# Patient Record
Sex: Male | Born: 2000 | Race: White | Hispanic: No | Marital: Single | Smoking: Former smoker
Health system: Southern US, Community
[De-identification: ages and names within clinical notes are randomized; demographics above are authoritative.]

## PROBLEM LIST (undated history)

## (undated) ENCOUNTER — Ambulatory Visit (HOSPITAL_COMMUNITY): Payer: Medicaid Other

## (undated) DIAGNOSIS — F902 Attention-deficit hyperactivity disorder, combined type: Principal | ICD-10-CM

## (undated) DIAGNOSIS — F909 Attention-deficit hyperactivity disorder, unspecified type: Secondary | ICD-10-CM

## (undated) DIAGNOSIS — T7840XA Allergy, unspecified, initial encounter: Secondary | ICD-10-CM

## (undated) DIAGNOSIS — F419 Anxiety disorder, unspecified: Secondary | ICD-10-CM

## (undated) DIAGNOSIS — F32A Depression, unspecified: Secondary | ICD-10-CM

## (undated) DIAGNOSIS — R278 Other lack of coordination: Secondary | ICD-10-CM

## (undated) HISTORY — DX: Other lack of coordination: R27.8

## (undated) HISTORY — PX: MYRINGOTOMY WITH TUBE PLACEMENT: SHX5663

## (undated) HISTORY — DX: Allergy, unspecified, initial encounter: T78.40XA

## (undated) HISTORY — DX: Depression, unspecified: F32.A

## (undated) HISTORY — PX: EYE MUSCLE SURGERY: SHX370

## (undated) HISTORY — DX: Attention-deficit hyperactivity disorder, combined type: F90.2

## (undated) HISTORY — DX: Anxiety disorder, unspecified: F41.9

## (undated) HISTORY — PX: ADENOIDECTOMY: SUR15

---
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
## 2022-01-05 ENCOUNTER — Ambulatory Visit (HOSPITAL_COMMUNITY)
Admission: AD | Admit: 2022-01-05 | Discharge: 2022-01-05 | Disposition: A | Discharge status: Home / Self Care | Payer: Medicaid Other | Attending: Psychiatry | Admitting: Psychiatry

## 2022-01-13 ENCOUNTER — Ambulatory Visit (HOSPITAL_COMMUNITY)
Admission: EM | Admit: 2022-01-13 | Discharge: 2022-01-13 | Disposition: A | Discharge status: Other Acute Inpatient Hospital | Payer: Medicaid Other | Attending: Registered Nurse | Admitting: Registered Nurse

## 2022-01-13 ENCOUNTER — Encounter (HOSPITAL_COMMUNITY): Payer: Self-pay | Admitting: Registered Nurse

## 2022-01-13 DIAGNOSIS — F902 Attention-deficit hyperactivity disorder, combined type: Secondary | ICD-10-CM

## 2022-01-13 DIAGNOSIS — Z1152 Encounter for screening for COVID-19: Secondary | ICD-10-CM | POA: Insufficient documentation

## 2022-01-13 DIAGNOSIS — F988 Other specified behavioral and emotional disorders with onset usually occurring in childhood and adolescence: Secondary | ICD-10-CM | POA: Diagnosis present

## 2022-01-13 DIAGNOSIS — F332 Major depressive disorder, recurrent severe without psychotic features: Secondary | ICD-10-CM

## 2022-01-13 DIAGNOSIS — Z818 Family history of other mental and behavioral disorders: Secondary | ICD-10-CM | POA: Insufficient documentation

## 2022-01-13 DIAGNOSIS — F640 Transsexualism: Secondary | ICD-10-CM | POA: Insufficient documentation

## 2022-01-13 DIAGNOSIS — F603 Borderline personality disorder: Secondary | ICD-10-CM

## 2022-01-13 DIAGNOSIS — Z9151 Personal history of suicidal behavior: Secondary | ICD-10-CM | POA: Insufficient documentation

## 2022-01-13 DIAGNOSIS — F411 Generalized anxiety disorder: Secondary | ICD-10-CM

## 2022-01-13 DIAGNOSIS — F649 Gender identity disorder, unspecified: Secondary | ICD-10-CM | POA: Diagnosis present

## 2022-01-13 DIAGNOSIS — R45851 Suicidal ideations: Secondary | ICD-10-CM

## 2022-01-13 LAB — LIPID PANEL
Cholesterol: 202 mg/dL — ABNORMAL HIGH (ref 0–200)
HDL: 30 mg/dL — ABNORMAL LOW (ref >40)
LDL Cholesterol: 143 mg/dL — ABNORMAL HIGH (ref 0–99)
Total CHOL/HDL Ratio: 6.7 RATIO
Triglycerides: 147 mg/dL (ref <150)
VLDL: 29 mg/dL (ref 0–40)

## 2022-01-13 LAB — COMPREHENSIVE METABOLIC PANEL
ALT: 20 U/L (ref 0–44)
AST: 19 U/L (ref 15–41)
Albumin: 4.3 g/dL (ref 3.5–5.0)
Alkaline Phosphatase: 93 U/L (ref 38–126)
Anion gap: 7 (ref 5–15)
BUN: 11 mg/dL (ref 6–20)
CO2: 25 mmol/L (ref 22–32)
Calcium: 9.2 mg/dL (ref 8.9–10.3)
Chloride: 106 mmol/L (ref 98–111)
Creatinine, Ser: 0.57 mg/dL — ABNORMAL LOW (ref 0.61–1.24)
GFR, Estimated: >60 mL/min (ref >60)
Glucose, Bld: 85 mg/dL (ref 70–99)
Potassium: 4.4 mmol/L (ref 3.5–5.1)
Sodium: 138 mmol/L (ref 135–145)
Total Bilirubin: 0.5 mg/dL (ref 0.3–1.2)
Total Protein: 7 g/dL (ref 6.5–8.1)

## 2022-01-13 LAB — CBC WITH DIFFERENTIAL/PLATELET
Abs Immature Granulocytes: 0.02 10*3/uL (ref 0.00–0.07)
Basophils Absolute: 0 10*3/uL (ref 0.0–0.1)
Basophils Relative: 0 %
Eosinophils Absolute: 0.1 10*3/uL (ref 0.0–0.5)
Eosinophils Relative: 1 %
HCT: 40.5 % (ref 39.0–52.0)
Hemoglobin: 14 g/dL (ref 13.0–17.0)
Immature Granulocytes: 0 %
Lymphocytes Relative: 35 %
Lymphs Abs: 3.4 10*3/uL (ref 0.7–4.0)
MCH: 28.9 pg (ref 26.0–34.0)
MCHC: 34.6 g/dL (ref 30.0–36.0)
MCV: 83.7 fL (ref 80.0–100.0)
Monocytes Absolute: 0.6 10*3/uL (ref 0.1–1.0)
Monocytes Relative: 6 %
Neutro Abs: 5.5 10*3/uL (ref 1.7–7.7)
Neutrophils Relative %: 58 %
Platelets: 268 10*3/uL (ref 150–400)
RBC: 4.84 MIL/uL (ref 4.22–5.81)
RDW: 11.9 % (ref 11.5–15.5)
WBC: 9.7 10*3/uL (ref 4.0–10.5)
nRBC: 0 % (ref 0.0–0.2)

## 2022-01-13 LAB — POCT URINE DRUG SCREEN - MANUAL ENTRY (I-SCREEN)
POC Amphetamine UR: NOT DETECTED
POC Buprenorphine (BUP): NOT DETECTED
POC Cocaine UR: NOT DETECTED
POC Marijuana UR: NOT DETECTED
POC Methadone UR: NOT DETECTED
POC Methamphetamine UR: NOT DETECTED
POC Morphine: NOT DETECTED
POC Oxazepam (BZO): NOT DETECTED
POC Oxycodone UR: NOT DETECTED
POC Secobarbital (BAR): NOT DETECTED

## 2022-01-13 LAB — TSH: TSH: 4.557 u[IU]/mL — ABNORMAL HIGH (ref 0.350–4.500)

## 2022-01-13 LAB — RESP PANEL BY RT-PCR (FLU A&B, COVID) ARPGX2
Influenza A by PCR: NEGATIVE
Influenza B by PCR: NEGATIVE
SARS Coronavirus 2 by RT PCR: NEGATIVE

## 2022-01-13 LAB — MAGNESIUM: Magnesium: 2.1 mg/dL (ref 1.7–2.4)

## 2022-01-13 LAB — ETHANOL: Alcohol, Ethyl (B): <10 mg/dL (ref <10)

## 2022-01-13 LAB — POC SARS CORONAVIRUS 2 AG: SARSCOV2ONAVIRUS 2 AG: NEGATIVE

## 2022-01-13 MED ORDER — ESTRADIOL VALERATE 10 MG/ML IM OIL: 0.4000 mg | TOPICAL_OIL | INTRAMUSCULAR | Status: DC

## 2022-01-13 MED ORDER — HYDROXYZINE HCL 25 MG PO TABS: 25.0000 mg | ORAL_TABLET | Freq: Three times a day (TID) | ORAL | Status: DC | PRN

## 2022-01-13 MED ORDER — ACETAMINOPHEN 325 MG PO TABS: 650.0000 mg | ORAL_TABLET | Freq: Four times a day (QID) | ORAL | Status: DC | PRN

## 2022-01-13 MED ORDER — TRAZODONE HCL 100 MG PO TABS
100.0000 mg | ORAL_TABLET | Freq: Every day | ORAL | Status: DC
  Administered 2022-01-13: 100 mg via ORAL
  Filled 2022-01-13: qty 1

## 2022-01-13 MED ORDER — FLUTICASONE PROPIONATE 50 MCG/ACT NA SUSP: 1.0000 | Freq: Every day | NASAL | Status: DC | PRN

## 2022-01-13 MED ORDER — ARIPIPRAZOLE 5 MG PO TABS
5.0000 mg | ORAL_TABLET | Freq: Every day | ORAL | Status: DC
  Administered 2022-01-13: 5 mg via ORAL
  Filled 2022-01-13: qty 1

## 2022-01-13 MED ORDER — HYDROXYZINE HCL 25 MG PO TABS: 25.0000 mg | ORAL_TABLET | Freq: Four times a day (QID) | ORAL | Status: DC | PRN

## 2022-01-13 MED ORDER — MAGNESIUM HYDROXIDE 400 MG/5ML PO SUSP: 30.0000 mL | Freq: Every day | ORAL | Status: DC | PRN

## 2022-01-13 MED ORDER — ALUM & MAG HYDROXIDE-SIMETH 200-200-20 MG/5ML PO SUSP: 30.0000 mL | ORAL | Status: DC | PRN

## 2022-01-13 MED ORDER — VENLAFAXINE HCL ER 150 MG PO CP24: 150.0000 mg | ORAL_CAPSULE | Freq: Every day | ORAL | Status: DC

## 2022-01-13 MED ORDER — PROGESTERONE MICRONIZED 100 MG PO CAPS
100.0000 mg | ORAL_CAPSULE | Freq: Every day | ORAL | Status: DC
  Filled 2022-01-13 (×2): qty 1
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
## 2022-01-13 NOTE — ED Notes (Signed)
Patient 21 y.o., Caucasian adult male to male transgender wants to be called "Scott [name]."  patient presented to [facility] as a walk in via [facility] with complaints of worsening depression, anxiety and suicidal ideation with plan to jump off the bridge.Patient is A&Ox4.  Patient denies HI and AVH. patient continues to have SI but contracts for safety in hospital. Patient denies any physical complaints when asked. No acute distress noted. Support and encouragement provided. Routine safety checks conducted according to facility protocol. Encouraged patient to notify staff if thoughts of harm toward self or others arise. Patient verbalize understanding and agreement. Will continue to monitor for safety.

## 2022-01-13 NOTE — Progress Notes (Signed)
Pt was accepted to [facility] TODAY 01/13/22 PENDING Labs, Negative COVID PCR, signed vol consent faxed to [phone]; Bed Assignment 401-1  Pt meets inpatient criteria per Assunta Found, NP  Attending Physician will be Dr. Sherron Flemings  Report can be called to: -Adult unit: [phone]  Pt can arrive: Night [facility] will assist and coordinate with care team  Care Team notified: Day [facility] Rona Ravens, RN, Night [facility] Gretta Arab, RN, Melynda Ripple, Counselor, and [place], NP  [place], LCSWA 01/13/2022 @ 8:26 PM

## 2022-01-14 ENCOUNTER — Encounter (HOSPITAL_COMMUNITY): Payer: Self-pay

## 2022-01-14 ENCOUNTER — Encounter (HOSPITAL_COMMUNITY): Payer: Self-pay | Admitting: Psychiatry

## 2022-01-14 ENCOUNTER — Other Ambulatory Visit: Payer: Self-pay

## 2022-01-14 ENCOUNTER — Inpatient Hospital Stay (HOSPITAL_COMMUNITY)
Admission: AD | Admit: 2022-01-14 | Discharge: 2022-01-18 | DRG: 885 | Disposition: A | Discharge status: Home / Self Care | Payer: Medicaid Other | Source: Intra-hospital | Attending: Psychiatry | Admitting: Psychiatry

## 2022-01-14 DIAGNOSIS — Z9151 Personal history of suicidal behavior: Secondary | ICD-10-CM

## 2022-01-14 DIAGNOSIS — F603 Borderline personality disorder: Secondary | ICD-10-CM | POA: Diagnosis present

## 2022-01-14 DIAGNOSIS — G4700 Insomnia, unspecified: Secondary | ICD-10-CM | POA: Diagnosis present

## 2022-01-14 DIAGNOSIS — Z79899 Other long term (current) drug therapy: Secondary | ICD-10-CM | POA: Diagnosis not present

## 2022-01-14 DIAGNOSIS — Z79891 Long term (current) use of opiate analgesic: Secondary | ICD-10-CM

## 2022-01-14 DIAGNOSIS — F332 Major depressive disorder, recurrent severe without psychotic features: Secondary | ICD-10-CM | POA: Diagnosis present

## 2022-01-14 DIAGNOSIS — F322 Major depressive disorder, single episode, severe without psychotic features: Secondary | ICD-10-CM | POA: Diagnosis present

## 2022-01-14 DIAGNOSIS — F329 Major depressive disorder, single episode, unspecified: Secondary | ICD-10-CM | POA: Insufficient documentation

## 2022-01-14 DIAGNOSIS — F640 Transsexualism: Secondary | ICD-10-CM | POA: Diagnosis present

## 2022-01-14 DIAGNOSIS — Z818 Family history of other mental and behavioral disorders: Secondary | ICD-10-CM

## 2022-01-14 DIAGNOSIS — F902 Attention-deficit hyperactivity disorder, combined type: Secondary | ICD-10-CM | POA: Diagnosis present

## 2022-01-14 DIAGNOSIS — Z560 Unemployment, unspecified: Secondary | ICD-10-CM

## 2022-01-14 DIAGNOSIS — F4310 Post-traumatic stress disorder, unspecified: Secondary | ICD-10-CM | POA: Diagnosis present

## 2022-01-14 DIAGNOSIS — Z813 Family history of other psychoactive substance abuse and dependence: Secondary | ICD-10-CM | POA: Diagnosis not present

## 2022-01-14 DIAGNOSIS — F411 Generalized anxiety disorder: Secondary | ICD-10-CM | POA: Diagnosis present

## 2022-01-14 DIAGNOSIS — F330 Major depressive disorder, recurrent, mild: Secondary | ICD-10-CM

## 2022-01-14 DIAGNOSIS — K219 Gastro-esophageal reflux disease without esophagitis: Secondary | ICD-10-CM | POA: Diagnosis present

## 2022-01-14 DIAGNOSIS — Z79890 Hormone replacement therapy (postmenopausal): Secondary | ICD-10-CM

## 2022-01-14 DIAGNOSIS — Z23 Encounter for immunization: Secondary | ICD-10-CM

## 2022-01-14 DIAGNOSIS — Z62811 Personal history of psychological abuse in childhood: Secondary | ICD-10-CM | POA: Diagnosis not present

## 2022-01-14 DIAGNOSIS — R45851 Suicidal ideations: Secondary | ICD-10-CM

## 2022-01-14 MED ORDER — MAGNESIUM HYDROXIDE 400 MG/5ML PO SUSP: 30.0000 mL | Freq: Every day | ORAL | Status: DC | PRN

## 2022-01-14 MED ORDER — TRAZODONE HCL 100 MG PO TABS
100.0000 mg | ORAL_TABLET | Freq: Every day | ORAL | Status: DC
  Administered 2022-01-14 – 2022-01-17 (×4): 100 mg via ORAL
  Filled 2022-01-14 (×6): qty 1

## 2022-01-14 MED ORDER — INFLUENZA VAC SPLIT QUAD 0.5 ML IM SUSY
0.5000 mL | PREFILLED_SYRINGE | INTRAMUSCULAR | Status: AC
  Administered 2022-01-15: 0.5 mL via INTRAMUSCULAR
  Filled 2022-01-14: qty 0.5

## 2022-01-14 MED ORDER — ARIPIPRAZOLE 15 MG PO TABS
7.5000 mg | ORAL_TABLET | Freq: Every day | ORAL | Status: DC
  Administered 2022-01-14 – 2022-01-17 (×4): 7.5 mg via ORAL
  Filled 2022-01-14 (×6): qty 1

## 2022-01-14 MED ORDER — ACETAMINOPHEN 325 MG PO TABS: 650.0000 mg | ORAL_TABLET | Freq: Four times a day (QID) | ORAL | Status: DC | PRN

## 2022-01-14 MED ORDER — MELATONIN 3 MG PO TABS
3.0000 mg | ORAL_TABLET | Freq: Every day | ORAL | Status: DC
  Filled 2022-01-14 (×2): qty 1

## 2022-01-14 MED ORDER — PROGESTERONE MICRONIZED 100 MG PO CAPS
100.0000 mg | ORAL_CAPSULE | Freq: Every day | ORAL | Status: DC
  Administered 2022-01-14 – 2022-01-18 (×5): 100 mg via ORAL
  Filled 2022-01-14 (×7): qty 1

## 2022-01-14 MED ORDER — HYDROXYZINE HCL 25 MG PO TABS
25.0000 mg | ORAL_TABLET | Freq: Four times a day (QID) | ORAL | Status: DC | PRN
  Administered 2022-01-14 – 2022-01-18 (×6): 25 mg via ORAL
  Filled 2022-01-14 (×6): qty 1

## 2022-01-14 MED ORDER — ARIPIPRAZOLE 5 MG PO TABS
5.0000 mg | ORAL_TABLET | Freq: Every day | ORAL | Status: DC
  Filled 2022-01-14: qty 1

## 2022-01-14 MED ORDER — VENLAFAXINE HCL ER 150 MG PO CP24
150.0000 mg | ORAL_CAPSULE | Freq: Every day | ORAL | Status: DC
  Administered 2022-01-14 – 2022-01-18 (×5): 150 mg via ORAL
  Filled 2022-01-14 (×8): qty 1

## 2022-01-14 MED ORDER — ESTRADIOL VALERATE 10 MG/ML IM OIL: 0.4000 mg | TOPICAL_OIL | INTRAMUSCULAR | Status: DC

## 2022-01-14 MED ORDER — ALUM & MAG HYDROXIDE-SIMETH 200-200-20 MG/5ML PO SUSP
30.0000 mL | ORAL | Status: DC | PRN
  Administered 2022-01-14 – 2022-01-15 (×2): 30 mL via ORAL
  Filled 2022-01-14 (×2): qty 30

## 2022-01-14 MED ORDER — FLUTICASONE PROPIONATE 50 MCG/ACT NA SUSP: 1.0000 | Freq: Every day | NASAL | Status: DC | PRN
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
## 2022-01-14 NOTE — Progress Notes (Signed)
EKG completed and tracing on front of chart with tracing from [facility].

## 2022-01-14 NOTE — BH IP Treatment Plan (Signed)
Interdisciplinary Treatment and Diagnostic Plan Update  01/14/2022 Time of Session: 10:00am  Scott [name] MRN: [id number]  Principal Diagnosis: MDD (major depressive disorder)  Secondary Diagnoses: Principal Problem:   MDD (major depressive disorder)   Current Medications:  Current Facility-Administered Medications  Medication Dose Route Frequency Provider Last Rate Last Admin   acetaminophen (TYLENOL) tablet 650 mg  650 mg Oral Q6H PRN Evette Georges, NP       alum & mag hydroxide-simeth (MAALOX/MYLANTA) 200-200-20 MG/5ML suspension 30 mL  30 mL Oral Q4H PRN Evette Georges, NP       ARIPiprazole (ABILIFY) tablet 7.5 mg  7.5 mg Oral QHS Nwoko, Agnes I, NP       [START ON 01/18/2022] Estradiol Valerate OIL 0.4 mg  0.4 mg Intramuscular Weekly Rankin, Shuvon B, NP       fluticasone (FLONASE) 50 MCG/ACT nasal spray 1 spray  1 spray Each Nare Daily PRN Rankin, Shuvon B, NP       hydrOXYzine (ATARAX) tablet 25 mg  25 mg Oral Q6H PRN Rankin, Shuvon B, NP   25 mg at 01/14/22 0108   [START ON 01/15/2022] influenza vac split quadrivalent PF (FLUARIX) injection 0.5 mL  0.5 mL Intramuscular Tomorrow-1000 Evette Georges, NP       magnesium hydroxide (MILK OF MAGNESIA) suspension 30 mL  30 mL Oral Daily PRN Evette Georges, NP       progesterone (PROMETRIUM) capsule 100 mg  100 mg Oral Daily Rankin, Shuvon B, NP   100 mg at 01/14/22 0818   traZODone (DESYREL) tablet 100 mg  100 mg Oral QHS Rankin, Shuvon B, NP       venlafaxine XR (EFFEXOR-XR) 24 hr capsule 150 mg  150 mg Oral Q breakfast Rankin, Shuvon B, NP   150 mg at 01/14/22 0818   PTA Medications: Medications Prior to Admission  Medication Sig Dispense Refill Last Dose   ARIPiprazole (ABILIFY) 5 MG tablet Take 1 tablet (5 mg total) by mouth at bedtime. 30 tablet 3    desvenlafaxine (PRISTIQ) 100 MG 24 hr tablet Take 1 tablet (100 mg total) by mouth daily. 30 tablet 3    dexmethylphenidate (FOCALIN XR) 20 MG 24 hr capsule Take 1 capsule (20 mg  total) by mouth daily. 30 capsule 0    Estradiol Valerate 10 MG/ML OIL INJECT 0.3 ML (3 MG) ONCE WEEKLY INTO THE MUSCLE AS DIRECTED 5 mL 1    fluticasone (FLONASE) 50 MCG/ACT nasal spray Place 1 spray into both nostrils daily as needed for allergies.      guanFACINE (INTUNIV) 2 MG TB24 ER tablet Take 1 tablet (2 mg total) by mouth daily. 90 tablet 1    hydrOXYzine (ATARAX) 25 MG tablet Take 1 tablet (25 mg total) by mouth every 6 (six) hours as needed for anxiety. 90 tablet 2    levocetirizine (XYZAL) 5 MG tablet Take 1 tablet (5 mg total) by mouth every evening. 90 tablet 1    montelukast (SINGULAIR) 10 MG tablet Take 10 mg by mouth daily.      NEEDLE, DISP, 18 G 18G X 1" MISC Use 1 needle weekly for IM estrogen administration 20 each 1    progesterone (PROMETRIUM) 100 MG capsule Take 1 capsule (100 mg total) by mouth daily. 30 capsule 3    Syringe, Disposable, 3 ML MISC Use 1 weekly for estradiol injections 25 each 0    traZODone (DESYREL) 100 MG tablet Take 1 tablet (100 mg total) by mouth at bedtime. [place]  tablet 2    triamcinolone ointment (KENALOG) 0.5 % Apply 1 application. topically 2 (two) times daily. 60 g 3     Patient Stressors: Financial difficulties   Marital or family conflict    Patient Strengths: Average or above average intelligence  Motivation for treatment/growth   Treatment Modalities: Medication Management, Group therapy, Case management,  1 to 1 session with clinician, Psychoeducation, Recreational therapy.   Physician Treatment Plan for Primary Diagnosis: MDD (major depressive disorder) Long Term Goal(s): Improvement in symptoms so as ready for discharge   Short Term Goals: Ability to identify and develop effective coping behaviors will improve Compliance with prescribed medications will improve Ability to identify triggers associated with substance abuse/mental health issues will improve Ability to identify changes in lifestyle to reduce recurrence of condition  will improve Ability to verbalize feelings will improve Ability to disclose and discuss suicidal ideas  Medication Management: Evaluate patient's response, side effects, and tolerance of medication regimen.  Therapeutic Interventions: 1 to 1 sessions, Unit Group sessions and Medication administration.  Evaluation of Outcomes: Not Met  Physician Treatment Plan for Secondary Diagnosis: Principal Problem:   MDD (major depressive disorder)  Long Term Goal(s): Improvement in symptoms so as ready for discharge   Short Term Goals: Ability to identify and develop effective coping behaviors will improve Compliance with prescribed medications will improve Ability to identify triggers associated with substance abuse/mental health issues will improve Ability to identify changes in lifestyle to reduce recurrence of condition will improve Ability to verbalize feelings will improve Ability to disclose and discuss suicidal ideas     Medication Management: Evaluate patient's response, side effects, and tolerance of medication regimen.  Therapeutic Interventions: 1 to 1 sessions, Unit Group sessions and Medication administration.  Evaluation of Outcomes: Not Met   RN Treatment Plan for Primary Diagnosis: MDD (major depressive disorder) Long Term Goal(s): Knowledge of disease and therapeutic regimen to maintain health will improve  Short Term Goals: Ability to remain free from injury will improve, Ability to participate in decision making will improve, Ability to verbalize feelings will improve, Ability to disclose and discuss suicidal ideas, and Ability to identify and develop effective coping behaviors will improve  Medication Management: RN will administer medications as ordered by provider, will assess and evaluate patient's response and provide education to patient for prescribed medication. RN will report any adverse and/or side effects to prescribing provider.  Therapeutic Interventions: 1 on  1 counseling sessions, Psychoeducation, Medication administration, Evaluate responses to treatment, Monitor vital signs and CBGs as ordered, Perform/monitor CIWA, COWS, AIMS and Fall Risk screenings as ordered, Perform wound care treatments as ordered.  Evaluation of Outcomes: Not Met   LCSW Treatment Plan for Primary Diagnosis: MDD (major depressive disorder) Long Term Goal(s): Safe transition to appropriate next level of care at discharge, Engage patient in therapeutic group addressing interpersonal concerns.  Short Term Goals: Engage patient in aftercare planning with referrals and resources, Increase social support, Increase emotional regulation, Facilitate acceptance of mental health diagnosis and concerns, Identify triggers associated with mental health/substance abuse issues, and Increase skills for wellness and recovery  Therapeutic Interventions: Assess for all discharge needs, 1 to 1 time with Social worker, Explore available resources and support systems, Assess for adequacy in community support network, Educate family and significant other(s) on suicide prevention, Complete Psychosocial Assessment, Interpersonal group therapy.  Evaluation of Outcomes: Not Met   Progress in Treatment: Attending groups: Yes. Participating in groups: Yes. Taking medication as prescribed: Yes. Toleration medication: Yes. Family/Significant  other contact made: Yes, individual(s) contacted:  Father  Patient understands diagnosis: No. Discussing patient identified problems/goals with staff: Yes. Medical problems stabilized or resolved: Yes. Denies suicidal/homicidal ideation: Yes. Issues/concerns per patient self-inventory: No.   New problem(s) identified: No, Describe:  None   New Short Term/Long Term Goal(s): medication stabilization, elimination of SI thoughts, development of comprehensive mental wellness plan.   Patient Goals: "To get away from my father"   Discharge Plan or Barriers:  Patient recently admitted. CSW will continue to follow and assess for appropriate referrals and possible discharge planning.   Reason for Continuation of Hospitalization: Anxiety Depression Medication stabilization Suicidal ideation  Estimated Length of Stay: 3 to 7 days   Last 3 [place] Suicide Severity Risk Score: [place] Admission (Current) from 01/14/2022 in [place] [facility] ED from 01/13/2022 in [facility] OP Visit from 09/14/2021 in [place] CATEGORY High Risk Low Risk Low Risk       Last PHQ 2/9 Scores:    01/13/2022    6:24 PM 08/29/2021   10:15 AM 07/22/2021    2:48 PM  Depression screen PHQ 2/9  Decreased Interest _0 Down, Depressed, Hopeless _1 PHQ - 2 Score _2 Altered sleeping _3 Tired, decreased energy _4 Change in appetite _5 Feeling bad or failure about yourself  _6 Trouble concentrating _7 Moving slowly or fidgety/restless 0 3 0  Suicidal thoughts 3 0   PHQ-9 Score _8 Difficult doing work/chores Extremely dIfficult Extremely dIfficult     Scribe for Treatment Team: Darleen Crocker, Latanya Presser 01/14/2022 1:47 PM

## 2022-01-14 NOTE — Group Note (Signed)
Recreation Therapy Group Note   Group Topic:Team Building  Group Date: 01/14/2022 Start Time: 0930 End Time: 0955 Facilitators: Seneca Gadbois-McCall, LRT,CTRS Location: [address] Dayroom   Goal Area(s) Addresses:  Patient will effectively work with peer towards shared goal.  Patient will identify skills used to make activity successful.  Patient will identify how skills used during activity can be used to reach post d/c goals.   Group Description: Landing Pad. In teams of 3-5, patients were given 12 plastic drinking straws and an equal length of masking tape. Using the materials provided, patients were asked to build a landing pad to catch a golf ball dropped from approximately 5 feet in the air. All materials were required to be used by the team in their design. LRT facilitated post-activity discussion.   Affect/Mood: Flat   Participation Level: None   Participation Quality: None   Behavior: Withdrawn   Speech/Thought Process: N/A   Insight: N/A   Judgement: N/A   Modes of Intervention: STEM Activity   Patient Response to Interventions:  Disengaged   Education Outcome:  In group clarification offered    Clinical Observations/Individualized Feedback: Pt came to group for a few minutes before leaving and not returning.     Plan: Continue to engage patient in RT group sessions 2-3x/week.   Saxton Chain-McCall, LRT,CTRS  01/14/2022 12:25 PM
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
## 2022-01-14 NOTE — Progress Notes (Signed)
   01/14/22 0537  Sleep  Number of Hours 4 (late admission)

## 2022-01-14 NOTE — Plan of Care (Signed)
  Problem: Education: Goal: Verbalization of understanding the information provided will improve Outcome: Progressing   Problem: Coping: Goal: Ability to verbalize frustrations and anger appropriately will improve Outcome: Progressing   Problem: Health Behavior/Discharge Planning: Goal: Compliance with treatment plan for underlying cause of condition will improve Outcome: Progressing

## 2022-01-14 NOTE — Tx Team (Signed)
Initial Treatment Plan 01/14/2022 1:13 AM Scott [name] VPC:[id number]    PATIENT STRESSORS: Financial difficulties   Marital or family conflict     PATIENT STRENGTHS: Average or above average intelligence  Motivation for treatment/growth    PATIENT IDENTIFIED PROBLEMS: SI with plan to jump off of a freeway overpass  Conflict with father (whom she lives with)  ("Pt wants to stop living with father and go somewhere else")                 DISCHARGE CRITERIA:  Adequate post-discharge living arrangements Improved stabilization in mood, thinking, and/or behavior Verbal commitment to aftercare and medication compliance  PRELIMINARY DISCHARGE PLAN: Return to previous living arrangement  PATIENT/FAMILY INVOLVEMENT: This treatment plan has been presented to and reviewed with the patient, Scott [name], and/or family member.  The patient and family have been given the opportunity to ask questions and make suggestions.  Victorino December, RN 01/14/2022, 1:13 AM

## 2022-01-14 NOTE — Progress Notes (Signed)
   01/14/22 0045  Psych Admission Type (Psych Patients Only)  Admission Status Voluntary  Psychosocial Assessment  Patient Complaints Decreased concentration  Eye Contact Brief  Facial Expression Animated  Affect Anxious;Sullen  Speech Logical/coherent  Interaction Assertive  Motor Activity Other (Comment) (wnl)  Appearance/Hygiene Unremarkable  Behavior Characteristics Cooperative;Appropriate to situation  Mood Depressed  Thought Process  Coherency WDL  Content WDL  Delusions None reported or observed  Perception WDL  Hallucination None reported or observed  Judgment Poor  Confusion None  Danger to Self  Current suicidal ideation? Denies  Agreement Not to Harm Self Yes  Description of Agreement contracts for safety while at [facility]  Danger to Others  Danger to Others None reported or observed

## 2022-01-14 NOTE — Progress Notes (Addendum)
Patient observed pacing up and down hallway. Patient appears anxious at times but cooperative. Patient tearful earlier this morning stating she is feeling overwhelmed and does not wish to return to her dad's house. Patient stated she has one friend who lives in [place] who is willing to pick her up when she is ready to discharge. Patient expresses her father has not been supportive and "only allowed me take hormones once he found out I almost killed myself." Patient also continued to ask about her ADHD medication as she stated it is really important for her to function. NP notified. Patient provided with emotional support and was able to calm down. Patient out in milieu observed socializing and remains safe on unit.    01/14/22 0818  Psych Admission Type (Psych Patients Only)  Admission Status Voluntary  Psychosocial Assessment  Patient Complaints Depression;Worrying;Anxiety  Eye Contact Fair  Facial Expression Sad  Affect Anxious;Depressed;Sad  Speech Logical/coherent  Interaction Assertive  Motor Activity Other (Comment) (WDL)  Appearance/Hygiene Unremarkable  Behavior Characteristics Cooperative;Anxious  Mood Depressed;Anxious;Pleasant  Thought Process  Coherency WDL  Content WDL  Delusions None reported or observed  Perception WDL  Hallucination None reported or observed  Judgment Limited  Confusion None  Danger to Self  Current suicidal ideation? Denies  Self-Injurious Behavior No self-injurious ideation or behavior indicators observed or expressed   Agreement Not to Harm Self Yes  Description of Agreement Contracts for safety on unit  Danger to Others  Danger to Others None reported or observed

## 2022-01-15 DIAGNOSIS — F411 Generalized anxiety disorder: Secondary | ICD-10-CM | POA: Insufficient documentation

## 2022-01-15 LAB — HEMOGLOBIN A1C
Hgb A1c MFr Bld: 5.3 % (ref 4.8–5.6)
Mean Plasma Glucose: 105 mg/dL

## 2022-01-15 MED ORDER — PANTOPRAZOLE SODIUM 40 MG PO TBEC
40.0000 mg | DELAYED_RELEASE_TABLET | Freq: Every day | ORAL | Status: DC
  Administered 2022-01-15 – 2022-01-18 (×4): 40 mg via ORAL
  Filled 2022-01-15 (×7): qty 1

## 2022-01-15 MED ORDER — HYDROCORTISONE 1 % EX CREA
TOPICAL_CREAM | CUTANEOUS | Status: DC | PRN
  Filled 2022-01-15: qty 28

## 2022-01-15 NOTE — Progress Notes (Signed)
Pt medication compliant. Pt received protonix for c/o reflux symptoms. Pt reports depression 2/10 and anxiety 1/10 per self inventory form. Pt denies suicidal thoughts. Pt denies a/v hallucinations. Pt endorses cravings for mind altering substances. Q 15 minute checks ongoing for safety.
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
## 2022-01-15 NOTE — Progress Notes (Signed)
CSW provided the Pt with a packet that contains information including shelter and housing resources, free and reduced price food information, clothing resources, crisis center information, a [employer] card, and suicide prevention information.   

## 2022-01-15 NOTE — Progress Notes (Signed)
   01/15/22 0558  Sleep  Number of Hours 6.75
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
## 2022-01-15 NOTE — B[facility] Counselor (Addendum)
CSW spoke with Mr. Abbe Amsterdam (Father) who states that he would like his daughter to go to a group home or another inpatient facility for mental health.  CSW discussed with Mr. Abbe Amsterdam what would be needed for a group home placement or a mental health treatment center.  CSW informed Mr. Abbe Amsterdam that his daughter is currently looking at shelter options due to having no SSDI, [employer], or a Marine scientist.  He states that he does not feel that a shelter would be safe for his daughter.  He states that he would like a day to think about his options.  CSW will follow up with Mr. Abbe Amsterdam on 01/16/2022.   Mr. Abbe Amsterdam contacted CSW again at 1:57pm on 01/15/2022.  He states that his daughter broke her phone 3 weeks ago and he needs her to contact him with the code to get into the phone so the phone company can fix it.  He states that his daughter decided one week ago to move to [place] with a male friend.  He states that she does not know the males last name and has not known him for very long.  He states that a family friend convinced her not to go at that time. He states that he is very concerned about his daughter and her current choices.
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
## 2022-01-16 MED ORDER — PROPRANOLOL HCL 10 MG PO TABS
10.0000 mg | ORAL_TABLET | Freq: Two times a day (BID) | ORAL | Status: DC
  Administered 2022-01-16 – 2022-01-17 (×2): 10 mg via ORAL
  Filled 2022-01-16 (×6): qty 1
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
## 2022-01-16 NOTE — Progress Notes (Signed)
Pt given daily dose of Protonix early because pt requested it before breakfast.

## 2022-01-16 NOTE — Progress Notes (Signed)
   01/16/22 0627  Sleep  Number of Hours 6.75

## 2022-01-16 NOTE — Progress Notes (Signed)
Adult Psychoeducational Group Note  Date:  01/16/2022 Time:  4:08 PM  Group Topic/Focus:  Coping With Mental Health Crisis:   The purpose of this group is to help patients identify strategies for coping with mental health crisis.  Group discusses possible causes of crisis and ways to manage them effectively.  Participation Level:  Active  Participation Quality:  Appropriate  Affect:  Appropriate  Cognitive:  Appropriate  Insight: Appropriate  Engagement in Group:  Engaged  Modes of Intervention:  Discussion  Additional Comments: Patient attended afternoon MHT Therapeutical group.    Scott [name] Scott [name] [name] 01/16/2022, 4:08 PM

## 2022-01-16 NOTE — Progress Notes (Signed)
Adult Psychoeducational Group Note  Date:  01/16/2022 Time:  11:29 AM  Group Topic/Focus:  Orientation:   The focus of this group is to educate the patient on the purpose and policies of crisis stabilization and provide a format to answer questions about their admission.  The group details unit policies and expectations of patients while admitted.  Participation Level:  Active  Participation Quality:  Appropriate  Affect:  Appropriate  Cognitive:  Appropriate  Insight: Appropriate  Engagement in Group:  Engaged  Modes of Intervention:  Discussion  Additional Comments:  Patient attended morning orientation/goals group and participated.   Karel Turpen W Joana Nolton 01/16/2022, 11:29 AM

## 2022-01-16 NOTE — Progress Notes (Signed)
Pt medication compliant. Pt social with peers and out of room on unit. Pt denies suicidal thoughts. Pt denied anxiety this morning. Pt did endorse anxiety this afternoon, using origami to distract self. Pt endorses cravings for mind altering substances. Inquiring about covid vaccine. Q 15 minute checks ongoing.
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
## 2022-01-17 MED ORDER — PROPRANOLOL HCL 10 MG PO TABS
10.0000 mg | ORAL_TABLET | Freq: Every day | ORAL | Status: DC
  Filled 2022-01-17 (×2): qty 1

## 2022-01-17 NOTE — Group Note (Deleted)
LCSW Group Therapy Note  Group Date: 01/17/2022 Start Time: 1000 End Time: 1100   Type of Therapy and Topic:  Group Therapy: Using "I" Statements  Participation Level:  {BHH PARTICIPATION LEVEL:22264}  Description of Group:  Patients were asked to provide details of some interpersonal conflicts they have experienced. Patients were then educated about "I" statements, communication which focuses on feelings or views of the speaker rather than what the other person is doing. T group members were asked to reflect on past conflicts and to provide specific examples for utilizing "I" statements.  Therapeutic Goals:  1. Patients will verbalize understanding of ineffective communication and effective communication. 2. Patients will be able to empathize with whom they are having conflict. 3. Patients will practice effective communication in the form of "I" statements.    Summary of Patient Progress:  *** shared ***. The patient was ***present/active throughout the session and proved open to feedback from CSW and peers. Patient demonstrated *** insight into the subject matter, was respectful of peers, and was present throughout the entire session.  Therapeutic Modalities:   Cognitive Behavioral Therapy Solution-Focused Therapy    Scott Millea J Grossman-Orr, LCSW 01/17/2022  9:42 AM    

## 2022-01-17 NOTE — BHH Group Notes (Signed)
.  Psychoeducational Group Note    Date:  01/17/2022 Time:1300-1400    Purpose of Group: . The group focus' on teaching patients on how to identify their needs and their Life Skills:  A group where two lists are made. What people need and what are things that we do that are unhealthy. The lists are developed by the patients and it is explained that we often do the actions that are not healthy to get our list of needs met.  Goal:: to develop the coping skills needed to get their needs met  Participation Level:  Active  Participation Quality:  Appropriate  Affect:  Appropriate  Cognitive:  Oriented  Insight: Improving  Engagement in Group:  Engaged  Modes of Intervention:  Activity, Discussion, Education, and Support  Additional Comments:  Rates energy at a 9/10. Participated fully in the group.  Dione Housekeeper

## 2022-01-17 NOTE — B[facility] Group Notes (Signed)
Psychoeducational Group Note  Date: 01/17/22 Time: 0900-1000    Goal Setting   Purpose of Group: This group helps to provide patients with the steps of setting a goal that is specific, measurable, attainable, realistic and time specific. A discussion on how we keep ourselves stuck with negative self talk. Homework given for Patients to write 30 positive attributes about themselves.    Participation Level:  did not attend  Alajia Schmelzer A 

## 2022-01-17 NOTE — Progress Notes (Signed)
   01/17/22 0802  Sleep  Number of Hours 8.25

## 2022-01-17 NOTE — Progress Notes (Signed)
Pt is A&OX4, calm, denies suicidal ideations, denies homicidal ideations, denies auditory hallucinations and denies visual hallucinations. Pt verbally agrees to approach staff if these become apparent and before harming self or others. Pt denies experiencing nightmares. Mood and affect are congruent. Pt appetite is ok. No complaints of anxiety, distress, pain and/or discomfort at this time. Pt's memory appears to be grossly intact, and Pt hasn't displayed any injurious behaviors. Pt is medication compliant. There's no evidence of suicidal intent. Psychomotor activity was WNL. No s/s of Parkinson, Dystonia, Akathisia and/or Tardive Dyskinesia noted.   

## 2022-01-17 NOTE — Progress Notes (Signed)
Adult Psychoeducational Group Note  Date:  01/17/2022 Time:  9:23 PM  Group Topic/Focus:  Wrap-Up Group:   The focus of this group is to help patients review their daily goal of treatment and discuss progress on daily workbooks.  Participation Level:  Active  Participation Quality:  Appropriate  Affect:  Appropriate  Cognitive:  Appropriate  Insight: Appropriate  Engagement in Group:  Engaged  Modes of Intervention:  Education and Exploration  Additional Comments:  Patient attended and participating in group tonight. She reports that her day was a 5 because she felt tired all day. He like that he is musical.  Scot Dock 01/17/2022, 9:23 PM

## 2022-01-17 NOTE — Progress Notes (Signed)
Pt request Writer contact [name]. Writer contacted [name], Loel Ro via telephone. Father request attending physician contact him via telephone. Writer notified attending physician and supplied father's telephone number to [facility] NP.

## 2022-01-17 NOTE — Group Note (Signed)
LCSW Group Therapy Note   No social work group was held; the following was provided to each patient  in lieu of in-person group:  Healthy vs. Unhealthy  Coping Skills and Supports   Unhealthy Qualities                                             Healthy Qualities Works (at first) Works   Stops working or starts hurting Continues working  Fast Usually takes time to develop  Easy Often difficult to learn  Usually a habit Usually unknown, has to become a habit  Can do alone Often need to reach out for help   Leads to loss Leads to gain         My Unhealthy Coping Skills                                    My Healthy Coping Skills                       My Unhealthy Supports                                           My Healthy Supports                         Nyles Mitton Grossman-Orr, LCSW 01/17/2022  5:47 PM     
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
## 2022-01-17 NOTE — Progress Notes (Addendum)
   01/16/22 1945  Psych Admission Type (Psych Patients Only)  Admission Status Voluntary  Psychosocial Assessment  Patient Complaints Anxiety;Other (Comment) (Concerned over previous elevated pulse rate.)  Eye Contact Fair  Facial Expression Animated  Affect Appropriate to circumstance  Speech Logical/coherent  Interaction Assertive;Intrusive  Motor Activity Other (Comment) (WDL)  Appearance/Hygiene Unremarkable  Behavior Characteristics Cooperative  Mood Pleasant  Thought Process  Coherency WDL  Content WDL  Delusions None reported or observed  Perception WDL  Hallucination None reported or observed  Judgment Impaired  Confusion None  Danger to Self  Current suicidal ideation? Denies  Description of Suicide Plan No plan.  Self-Injurious Behavior No self-injurious ideation or behavior indicators observed or expressed   Agreement Not to Harm Self Yes  Description of Agreement Verbally contracts for safety.  Danger to Others  Danger to Others None reported or observed   Patient alert and oriented. Presenting appropriate to circumstance and pleasant. Patient denies SI, HI, AVH, and pain. Patient rates anxiety 1/10, with 0 being the least and 10 being the worst. Patient denies depression at this time. Scheduled medications administered to patient, per provider orders. PRN hydroxyzine administered, per provider orders. Patient is concerned about elevated pulse rate earlier in the day and requests frequent checks of pulse rate. Pulse rate checked with vitals and within normal limit. Support and encouragement provided. Routine safety checks conducted every 15 minutes. Patient verbally contracts for safety. Patient remains safe on the unit.

## 2022-01-17 NOTE — Progress Notes (Signed)
Adult Psychoeducational Group Note  Date:  01/17/2022 Time:  11:16 AM  Group Topic/Focus:  Orientation:   The focus of this group is to educate the patient on the purpose and policies of crisis stabilization and provide a format to answer questions about their admission.  The group details unit policies and expectations of patients while admitted.  Participation Level:  Active  Participation Quality:  Appropriate  Affect:  Appropriate  Cognitive:  Appropriate  Insight: Appropriate  Engagement in Group:  Engaged  Modes of Intervention:  Discussion  Additional Comments:  Patient attended morning orientation group and participated.   Tranae Laramie W Valena Ivanov 01/17/2022, 11:16 AM

## 2022-01-17 NOTE — Progress Notes (Addendum)
Patient c/o "dizziness." Writer asked Pt to return to room to rest and to allow vitals to be retrieved. Pt complied. Pt believes the progesterone is the attributing factor.  Pt vitals were as follows: BP 117/82 P86 PO 98% Writer notified attending physician.

## 2022-01-17 NOTE — Progress Notes (Signed)
Received a message from the nurse that patient's father is requesting a phone call from this provider about the new medication patient is on called Propranolol. Called patient's father at [phone]. Patient's father wants to know the side effects for Propranolol, the new medication started yesterday for patient's increased heart rate. Explained to patient's father that the side effects of propranolol include: Headaches. dizziness, nausea or vomiting or diarrhea, stomach pain. Patient's father says he is asking for this information to know what to look out for because patient is already taking a lot of other medications prior to this one. He is assured that patient is being monitored closely by the [facility] staff.

## 2022-01-18 DIAGNOSIS — F332 Major depressive disorder, recurrent severe without psychotic features: Secondary | ICD-10-CM

## 2022-01-18 MED ORDER — PROPRANOLOL HCL 10 MG PO TABS: 10.0000 mg | ORAL_TABLET | Freq: Every day | ORAL | 0 refills | Status: DC

## 2022-01-18 MED ORDER — ARIPIPRAZOLE 15 MG PO TABS: 7.5000 mg | ORAL_TABLET | Freq: Every day | ORAL | 0 refills | Status: DC

## 2022-01-18 MED ORDER — HYDROXYZINE HCL 25 MG PO TABS: 25.0000 mg | ORAL_TABLET | Freq: Four times a day (QID) | ORAL | 0 refills | Status: DC | PRN

## 2022-01-18 MED ORDER — PANTOPRAZOLE SODIUM 40 MG PO TBEC: 40.0000 mg | DELAYED_RELEASE_TABLET | Freq: Every day | ORAL | 0 refills | Status: DC

## 2022-01-18 MED ORDER — TRAZODONE HCL 100 MG PO TABS: 100.0000 mg | ORAL_TABLET | Freq: Every day | ORAL | 0 refills | Status: DC

## 2022-01-18 MED ORDER — VENLAFAXINE HCL ER 150 MG PO CP24: 150.0000 mg | ORAL_CAPSULE | Freq: Every day | ORAL | 0 refills | Status: DC

## 2022-01-18 NOTE — Progress Notes (Signed)
  [facility] Adult Case Management Discharge Plan :  Will you be returning to the same living situation after discharge:  Yes,  father At discharge, do you have transportation home?: Yes,  father Do you have the ability to pay for your medications: Yes,  Medicaid  Release of information consent forms completed and emailed to Medical Records, then turned in to Medical Records by CSW.   Patient to Follow up at:  Follow-up Information     [facility] Follow up on 01/27/2022.   Specialty: Behavioral Health Why: You have an appointment for medication management services with Dr. Morrie Sheldon on12/12/23 @ 10 a.m.  (please call to confirm setting).  You also have an appointment for therapy services with Paige for therapy services on 02/05/22 @ 8 am. This appointment will be Virtual . Contact information: [address] [place] [address] [phone]        [facility] Go on 01/20/2022.   Specialty: Professional Counselor Why: Your next appointment for therapy services is on 01/20/22 at 2:00 pm.  The appointment will be held IN PERSON. Contact information: [address] [place] [address] [phone]                 Next level of care provider has access to [facility] Link:no  Safety Planning and Suicide Prevention discussed: Yes,  with father     Has patient been referred to the Quitline?: N/A patient is not a smoker  Patient has been referred for addiction treatment: Yes  Lynnell Chad, LCSW 01/18/2022, 8:46 AM

## 2022-01-18 NOTE — BHH Suicide Risk Assessment (Signed)
Suicide Risk Assessment  Discharge Assessment    [facility] Discharge Suicide Risk Assessment   Principal Problem: MDD (major depressive disorder), recurrent severe, without psychosis (HCC)  Discharge Diagnoses: Principal Problem:   MDD (major depressive disorder), recurrent severe, without psychosis (HCC) Active Problems:   ADHD (attention deficit hyperactivity disorder), combined type   PTSD (post-traumatic stress disorder)   GAD (generalized anxiety disorder)  Total Time spent with patient:  Greater than 30 minutes  Musculoskeletal: Strength & Muscle Tone: within normal limits Gait & Station: normal Patient leans: N/A  Psychiatric Specialty Exam  Presentation  General Appearance:  Appropriate for Environment; Fairly Groomed; Casual  Eye Contact: Good  Speech: Normal Rate  Speech Volume: Normal  Handedness: Right   Mood and Affect  Mood: Euthymic  Duration of Depression Symptoms: Greater than two weeks  Affect: Appropriate; Congruent  Thought Process  Thought Processes: Goal Directed; Coherent  Descriptions of Associations:Intact  Orientation:Full (Time, Place and Person)  Thought Content:Logical  History of Schizophrenia/Schizoaffective disorder:No  Duration of Psychotic Symptoms:No data recorded Hallucinations:Hallucinations: None  Ideas of Reference:None  Suicidal Thoughts:Suicidal Thoughts: No SI Active Intent and/or Plan: Without Intent; Without Plan; Without Means to Carry Out; Without Access to Means  Homicidal Thoughts:Homicidal Thoughts: No  Sensorium  Memory: Immediate Good; Recent Good; Remote Good  Judgment: Good  Insight: Good; Present  Executive Functions  Concentration: Good  Attention Span: Good  Recall: Good  Fund of Knowledge: Good  Language: Good  Psychomotor Activity  Psychomotor Activity: Psychomotor Activity: Normal  Assets  Assets: Communication Skills; Desire for Improvement; Financial  Resources/Insurance; Housing; Physical Health; Resilience; Social Support  Sleep  Sleep: Sleep: Good Number of Hours of Sleep: 8.25   Physical Exam: See discharge summary.  Blood pressure 128/80, pulse (!) 125, temperature (!) 97.5 F (36.4 C), temperature source Oral, resp. rate 16, height 5\' 5"  (1.651 m), weight 80.3 kg, SpO2 98 %. Body mass index is 29.45 kg/m.  Mental Status Per Nursing Assessment::   On Admission:  NA  Demographic Factors:  Adolescent or young adult  Loss Factors: NA  Historical Factors: Impulsivity  Risk Reduction Factors:   Living with another person, especially a relative, Positive social support, Positive therapeutic relationship, and Positive coping skills or problem solving skills  Continued Clinical Symptoms:  Depression:   Impulsivity  Cognitive Features That Contribute To Risk:  Closed-mindedness, Polarized thinking, and Thought constriction (tunnel vision)    Suicide Risk:  Minimal: No identifiable suicidal ideation.  Patients presenting with no risk factors but with morbid ruminations; may be classified as minimal risk based on the severity of the depressive symptoms   Follow-up Information     [facility] Follow up on 01/27/2022.   Specialty: Behavioral Health Why: You have an appointment for medication management services with Dr. 14/01/2022 on12/12/23 @ 10 a.m.  (please call to confirm setting).  You also have an appointment for therapy services with Paige for therapy services on 02/05/22 @ 8 am. This appointment will be Virtual . Contact information: [address] [place] [phone]        [facility] Go on 01/20/2022.   Specialty: Professional Counselor Why: Your next appointment for therapy services is on 01/20/22 at 2:00 pm.  The appointment will be held IN PERSON. Contact information: [address] [place] [phone]                 Plan Of Care/Follow-up recommendations:  See the discharge  instruction/recommendation above.  Armandina Stammer, NP, pmhnp, fnp-bc. 01/18/2022, 10:16 AM
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
## 2022-01-18 NOTE — Progress Notes (Signed)
Attempted to contact patient's father twice per father's request, no response. Patient being discharged today, father is aware of dc plan, staff including NP and Sw spoke to patient's father several times during this hospitalization addressing all concerns.

## 2022-01-18 NOTE — Plan of Care (Signed)
Nurse discussed coping skills with patient.  

## 2022-01-18 NOTE — Progress Notes (Signed)
   01/18/22 0100  Psych Admission Type (Psych Patients Only)  Admission Status Voluntary  Psychosocial Assessment  Patient Complaints Anxiety  Eye Contact Fair  Facial Expression Animated  Affect Appropriate to circumstance  Speech Logical/coherent  Interaction Assertive  Motor Activity Other (Comment) (WDL)  Appearance/Hygiene Unremarkable  Behavior Characteristics Cooperative  Mood Pleasant  Thought Process  Coherency WDL  Content WDL  Delusions None reported or observed  Perception WDL  Hallucination None reported or observed  Judgment Impaired  Confusion None  Danger to Self  Current suicidal ideation? Denies  Agreement Not to Harm Self Yes  Description of Agreement verbally pt contracts for safety  Danger to Others  Danger to Others None reported or observed

## 2022-01-18 NOTE — Group Note (Signed)
[facility] LCSW Group Therapy Note   Group Date: 01/18/2022 Start Time: 1015 End Time: 1115   Type of Therapy/Topic:  Group Therapy:  Balance in Life  Participation Level:  Active   Description of Group:    This group will address the concept of balance and how it feels and looks when one is unbalanced. Patients will be encouraged to process areas in their lives that are out of balance, and identify reasons for remaining unbalanced. Facilitators will guide patients utilizing problem- solving interventions to address and correct the stressor making their life unbalanced. Understanding and applying boundaries will be explored and addressed for obtaining  and maintaining a balanced life. Patients will be encouraged to explore ways to assertively make their unbalanced needs known to significant others in their lives, using other group members and facilitator for support and feedback.  Therapeutic Goals: Patient will identify two or more emotions or situations they have that consume much of in their lives. Patient will identify signs/triggers that life has become out of balance:  Patient will identify two ways to set boundaries in order to achieve balance in their lives:  Patient will demonstrate ability to communicate their needs through discussion and/or role plays  Summary of Patient Progress:    Patient was open and active while in group    Therapeutic Modalities:   Cognitive Behavioral Therapy Solution-Focused Therapy Assertiveness Training   Marinda Elk, LCSW

## 2022-01-18 NOTE — BHH Group Notes (Signed)
Adult Psychoeducational Group Note Date:  01/18/2022 Time:  0900-1000 Group Topic/Focus: PROGRESSIVE RELAXATION. A group where deep breathing is taught and tensing and relaxation muscle groups is used. Imagery is used as well.  Pts are asked to imagine 3 pillars that hold them up when they are not able to hold themselves up and to share that with the group.   Participation Level: did not attend Additional Comments:     : Dione Housekeeper

## 2022-01-18 NOTE — Progress Notes (Addendum)
D:  Patient denied SI and HI, contracts for safety.  Denied A/V hallucinations.  Denied pain. A:  Medications administered per MD orders.  Emotional support and encouragement given patient. R:  Safety maintained with 15 minute checks.  Patient looking forward to discharge home today.

## 2022-01-18 NOTE — BHH Counselor (Signed)
Clinical Social Work Note  CSW returned phone call to father who expressed disappointment with the patient's discharge plan, asked about placement into a group home and/or guardianship for the patient.  CSW was forthright in answering about the logistics for each of those items.  CSW did provide the name of [facility] as the county's best resource for DBT.  CSW also recommended that he implement a signed contract regarding patient's behavior in the home, which he stated he did not think would work, but he ultimately stated he "might" give it a try.  While CSW was on the phone with father, patient was discharged to the lobby and tried to interrupt the phone call.  He was very stern with her about not interfering with a conversation he was having.  CSW empathized with his plight and shared that this hospital has not withheld any resources from him, but that there are few resources.  He expressed understanding.  Ambrose Mantle, LCSW 01/18/2022, 1:30 PM

## 2022-01-18 NOTE — Progress Notes (Signed)
Discharge Note:  Patient discharged home with dad.  Suicide prevention information given and discussed with patient who stated she understood and had no questions.  Denied SI and HI.  Denied A/V hallucinations.  Patient stated she received all her clothing, belongings, misc items, etc.  Patient stated she appreciated all assistance received from [facility] staff.  All required discharge information given.

## 2022-01-23 ENCOUNTER — Other Ambulatory Visit: Payer: Self-pay

## 2022-01-23 ENCOUNTER — Ambulatory Visit (HOSPITAL_COMMUNITY)
Admission: EM | Admit: 2022-01-23 | Discharge: 2022-01-24 | Disposition: A | Discharge status: Home / Self Care | Payer: Medicaid Other | Attending: Urology | Admitting: Urology

## 2022-01-23 ENCOUNTER — Ambulatory Visit (HOSPITAL_COMMUNITY)
Admission: RE | Admit: 2022-01-23 | Discharge: 2022-01-23 | Disposition: A | Discharge status: Home / Self Care | Payer: Medicaid Other | Attending: Psychiatry | Admitting: Psychiatry

## 2022-01-23 DIAGNOSIS — F640 Transsexualism: Secondary | ICD-10-CM | POA: Insufficient documentation

## 2022-01-23 DIAGNOSIS — Z1152 Encounter for screening for COVID-19: Secondary | ICD-10-CM | POA: Insufficient documentation

## 2022-01-23 DIAGNOSIS — F332 Major depressive disorder, recurrent severe without psychotic features: Secondary | ICD-10-CM | POA: Insufficient documentation

## 2022-01-23 DIAGNOSIS — R45851 Suicidal ideations: Secondary | ICD-10-CM | POA: Insufficient documentation

## 2022-01-23 DIAGNOSIS — F1290 Cannabis use, unspecified, uncomplicated: Secondary | ICD-10-CM | POA: Insufficient documentation

## 2022-01-23 LAB — POCT URINE DRUG SCREEN - MANUAL ENTRY (I-SCREEN)
POC Amphetamine UR: NOT DETECTED
POC Buprenorphine (BUP): NOT DETECTED
POC Cocaine UR: NOT DETECTED
POC Marijuana UR: NOT DETECTED
POC Methadone UR: NOT DETECTED
POC Methamphetamine UR: NOT DETECTED
POC Morphine: NOT DETECTED
POC Oxazepam (BZO): NOT DETECTED
POC Oxycodone UR: NOT DETECTED
POC Secobarbital (BAR): NOT DETECTED

## 2022-01-23 LAB — POC SARS CORONAVIRUS 2 AG: SARSCOV2ONAVIRUS 2 AG: NEGATIVE

## 2022-01-23 MED ORDER — HYDROXYZINE HCL 25 MG PO TABS
25.0000 mg | ORAL_TABLET | Freq: Three times a day (TID) | ORAL | Status: DC | PRN
  Administered 2022-01-24: 25 mg via ORAL
  Filled 2022-01-23: qty 1

## 2022-01-23 MED ORDER — ACETAMINOPHEN 325 MG PO TABS: 650.0000 mg | ORAL_TABLET | Freq: Four times a day (QID) | ORAL | Status: DC | PRN

## 2022-01-23 MED ORDER — TRAZODONE HCL 50 MG PO TABS
50.0000 mg | ORAL_TABLET | Freq: Every evening | ORAL | Status: DC | PRN
  Administered 2022-01-23: 50 mg via ORAL
  Filled 2022-01-23: qty 1

## 2022-01-23 MED ORDER — MAGNESIUM HYDROXIDE 400 MG/5ML PO SUSP: 30.0000 mL | Freq: Every day | ORAL | Status: DC | PRN

## 2022-01-23 MED ORDER — ALUM & MAG HYDROXIDE-SIMETH 200-200-20 MG/5ML PO SUSP: 30.0000 mL | ORAL | Status: DC | PRN

## 2022-01-23 NOTE — ED Notes (Signed)
Phone DASH pickup called and notified of pending collected STAT Labs to be transported to [facility] 
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
## 2022-01-24 LAB — CBC WITH DIFFERENTIAL/PLATELET
Abs Immature Granulocytes: 0.03 10*3/uL (ref 0.00–0.07)
Basophils Absolute: 0 10*3/uL (ref 0.0–0.1)
Basophils Relative: 0 %
Eosinophils Absolute: 0.1 10*3/uL (ref 0.0–0.5)
Eosinophils Relative: 1 %
HCT: 38.4 % — ABNORMAL LOW (ref 39.0–52.0)
Hemoglobin: 13 g/dL (ref 13.0–17.0)
Immature Granulocytes: 0 %
Lymphocytes Relative: 36 %
Lymphs Abs: 3.2 10*3/uL (ref 0.7–4.0)
MCH: 28.4 pg (ref 26.0–34.0)
MCHC: 33.9 g/dL (ref 30.0–36.0)
MCV: 83.8 fL (ref 80.0–100.0)
Monocytes Absolute: 0.6 10*3/uL (ref 0.1–1.0)
Monocytes Relative: 6 %
Neutro Abs: 4.9 10*3/uL (ref 1.7–7.7)
Neutrophils Relative %: 57 %
Platelets: 244 10*3/uL (ref 150–400)
RBC: 4.58 MIL/uL (ref 4.22–5.81)
RDW: 12.3 % (ref 11.5–15.5)
WBC: 8.8 10*3/uL (ref 4.0–10.5)
nRBC: 0 % (ref 0.0–0.2)

## 2022-01-24 LAB — COMPREHENSIVE METABOLIC PANEL
ALT: 18 U/L (ref 0–44)
AST: 17 U/L (ref 15–41)
Albumin: 4 g/dL (ref 3.5–5.0)
Alkaline Phosphatase: 90 U/L (ref 38–126)
Anion gap: 8 (ref 5–15)
BUN: 11 mg/dL (ref 6–20)
CO2: 25 mmol/L (ref 22–32)
Calcium: 9 mg/dL (ref 8.9–10.3)
Chloride: 103 mmol/L (ref 98–111)
Creatinine, Ser: 0.53 mg/dL — ABNORMAL LOW (ref 0.61–1.24)
GFR, Estimated: >60 mL/min (ref >60)
Glucose, Bld: 99 mg/dL (ref 70–99)
Potassium: 3.8 mmol/L (ref 3.5–5.1)
Sodium: 136 mmol/L (ref 135–145)
Total Bilirubin: 0.4 mg/dL (ref 0.3–1.2)
Total Protein: 6.6 g/dL (ref 6.5–8.1)

## 2022-01-24 LAB — TSH: TSH: 5.194 u[IU]/mL — ABNORMAL HIGH (ref 0.350–4.500)

## 2022-01-24 LAB — RESP PANEL BY RT-PCR (FLU A&B, COVID) ARPGX2
Influenza A by PCR: NEGATIVE
Influenza B by PCR: NEGATIVE
SARS Coronavirus 2 by RT PCR: NEGATIVE

## 2022-01-24 LAB — ETHANOL: Alcohol, Ethyl (B): <10 mg/dL (ref <10)

## 2022-01-24 MED ORDER — VENLAFAXINE HCL ER 150 MG PO CP24
150.0000 mg | ORAL_CAPSULE | Freq: Every day | ORAL | Status: DC
  Administered 2022-01-24: 150 mg via ORAL
  Filled 2022-01-24: qty 1

## 2022-01-24 MED ORDER — PROGESTERONE MICRONIZED 100 MG PO CAPS
100.0000 mg | ORAL_CAPSULE | Freq: Every day | ORAL | Status: DC
  Administered 2022-01-24: 100 mg via ORAL
  Filled 2022-01-24 (×2): qty 1

## 2022-01-24 MED ORDER — PROPRANOLOL HCL 10 MG PO TABS: 10.0000 mg | ORAL_TABLET | Freq: Every day | ORAL | Status: DC

## 2022-01-24 MED ORDER — PANTOPRAZOLE SODIUM 40 MG PO TBEC
40.0000 mg | DELAYED_RELEASE_TABLET | Freq: Every day | ORAL | Status: DC
  Administered 2022-01-24: 40 mg via ORAL
  Filled 2022-01-24: qty 1

## 2022-01-24 MED ORDER — ARIPIPRAZOLE 15 MG PO TABS: 7.5000 mg | ORAL_TABLET | Freq: Every day | ORAL | Status: DC

## 2022-01-24 NOTE — ED Notes (Signed)
Pt stated that his heart was having palpitations and requested Propanolol. Informed Pt that Propanolol was admin at bedtime and that she could receive Atarax for anxiety. Pt agreed. Patients heart rate is 97. Safety maintained and will continue to monitor.

## 2022-01-24 NOTE — Discharge Instructions (Addendum)

## 2022-01-24 NOTE — ED Notes (Signed)
Discharge instructions provided and Pt stated understanding. Pt alert, orient and ambulatory prior to d/c from facility. Personal belongings returned from locker number 15. Pt given taxi voucher to d/c from facility. Pt escorted to the lobby to meet taxi. Safety maintained.

## 2022-01-24 NOTE — ED Notes (Signed)
Patient observed/assessed in bed/chair resting quietly appearing in no distress and verbalizing no complaints at this time. Will continue to monitor.  
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
## 2022-01-24 NOTE — ED Notes (Signed)
Accepted scheduled meds w/o difficulty. Meal provided. Pt anxious to leave. Informed pt that this nurse was working on his d/c at the moment. Will call taxi for pt to d/c. Safety maintained and will continue to monitor.

## 2022-01-24 NOTE — ED Provider Notes (Cosign Needed Addendum)
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 ng/mL) Final   POC Oxycodone UR 01/13/2022 None Detected  NONE DETECTED (Cut Off Level 100 ng/mL) Final   POC Marijuana UR 01/13/2022 None Detected  NONE DETECTED (Cut Off Level 50 ng/mL) Final   SARSCOV2ONAVIRUS 2 AG 01/13/2022 NEGATIVE  NEGATIVE Final   Comment: (NOTE) SARS-CoV-2 antigen NOT DETECTED.   Negative results are presumptive.  Negative results do not preclude SARS-CoV-2 infection and should not be used as the sole basis for treatment or other patient management decisions, including infection  control decisions, particularly in the  presence of clinical signs and  symptoms consistent with COVID-19, or in those who have been in contact with the virus.  Negative results must be combined with clinical observations, patient history, and epidemiological information. The expected result is Negative.  Fact Sheet for Patients: HandmadeRecipes.com.cy  Fact Sheet for Healthcare Providers: FuneralLife.at  This test is not yet approved or cleared by the [place] FDA and  has been authorized for detection and/or diagnosis of SARS-CoV-2 by FDA under an Emergency Use Authorization (EUA).  This EUA will remain in effect (meaning this test can be used) for the duration of  the COV                          ID-19 declaration under Section 564(b)(1) of the Act, 21 U.S.C. section 360bbb-3(b)(1), unless the authorization is terminated or revoked sooner.    Admission on 09/12/2021, Discharged on 09/13/2021  Component Date Value Ref Range Status   WBC 09/12/2021 8.2  4.0 - 10.5 K/uL Final   RBC 09/12/2021 4.45  4.22 - 5.81 MIL/uL Final   Hemoglobin 09/12/2021 12.9 (L)  13.0 - 17.0 g/dL Final   HCT 09/12/2021 36.8 (L)  39.0 - 52.0 % Final   MCV 09/12/2021 82.7  80.0 - 100.0 fL Final   MCH 09/12/2021 29.0  26.0 - 34.0 pg Final   MCHC 09/12/2021 35.1  30.0 - 36.0 g/dL Final   RDW 09/12/2021 11.6  11.5 - 15.5 %  Final   Platelets 09/12/2021 277  150 - 400 K/uL Final   nRBC 09/12/2021 0.0  0.0 - 0.2 % Final   Neutrophils Relative % 09/12/2021 60  % Final   Neutro Abs 09/12/2021 5.0  1.7 - 7.7 K/uL Final   Lymphocytes Relative 09/12/2021 31  % Final   Lymphs Abs 09/12/2021 2.5  0.7 - 4.0 K/uL Final   Monocytes Relative 09/12/2021 7  % Final   Monocytes Absolute 09/12/2021 0.6  0.1 - 1.0 K/uL Final   Eosinophils Relative 09/12/2021 1  % Final   Eosinophils Absolute 09/12/2021 0.1  0.0 - 0.5 K/uL Final   Basophils Relative 09/12/2021 1  % Final   Basophils Absolute 09/12/2021 0.0  0.0 - 0.1 K/uL Final   Immature Granulocytes 09/12/2021 0  % Final   Abs Immature Granulocytes 09/12/2021 0.03  0.00 - 0.07 K/uL Final   Performed at [facility], [place] [address], [place], [place] [address]   Sodium 09/12/2021 136  135 - 145 mmol/L Final   Potassium 09/12/2021 3.4 (L)  3.5 - 5.1 mmol/L Final   Chloride 09/12/2021 105  98 - 111 mmol/L Final   CO2 09/12/2021 23  22 - 32 mmol/L Final   Glucose, Bld 09/12/2021 90  70 - 99 mg/dL Final   Glucose reference range applies only to samples taken after fasting for at least 8 hours.   BUN 09/12/2021 10  6 - 20 mg/dL Final   Creatinine, Ser 09/12/2021 0.58 (L)  0.61 - 1.24 mg/dL Final   Calcium 09/12/2021 9.1  8.9 - 10.3 mg/dL Final   Total Protein 09/12/2021 7.2  6.5 - 8.1 g/dL Final   Albumin 09/12/2021 3.9  3.5 - 5.0 g/dL Final   AST 09/12/2021 23  15 - 41 U/L Final   ALT 09/12/2021 22  0 - 44 U/L Final   Alkaline Phosphatase 09/12/2021 95  38 - 126 U/L Final   Total Bilirubin 09/12/2021 0.9  0.3 - 1.2 mg/dL  Final   GFR, Estimated 09/12/2021 >60  >60 mL/min Final   Comment: (NOTE) Calculated using the CKD-EPI Creatinine Equation (2021)    Anion gap 09/12/2021 8  5 - 15 Final   Performed at [facility], [place] [address] [place], [place] [address]   Hgb A1c MFr Bld 09/12/2021 4.8  4.8 - 5.6 % Final   Comment: (NOTE) Pre diabetes:           5.7%-6.4%  Diabetes:              >6.4%  Glycemic control for   <7.0% adults with diabetes    Mean Plasma Glucose 09/12/2021 91.06  mg/dL Final   Performed at [facility], [place] [address], [place], [place] [address]   Cholesterol 09/12/2021 188  0 - 200 mg/dL Final   Triglycerides 09/12/2021 88  <150 mg/dL Final   HDL 09/12/2021 38 (L)  >40 mg/dL Final   Total CHOL/HDL Ratio 09/12/2021 4.9  RATIO Final   VLDL 09/12/2021 18  0 - 40 mg/dL Final   LDL Cholesterol 09/12/2021 132 (H)  0 - 99 mg/dL Final   Comment:        Total Cholesterol/HDL:CHD Risk Coronary Heart Disease Risk Table                     Men   Women  1/2 Average Risk   3.4   3.3  Average Risk       5.0   4.4  2 X Average Risk   9.6   7.1  3 X Average Risk  23.4   11.0        Use the calculated Patient Ratio above and the CHD Risk Table to determine the patient's CHD Risk.        ATP III CLASSIFICATION (LDL):  <100     mg/dL   Optimal  100-129  mg/dL   Near or Above                    Optimal  130-159  mg/dL   Borderline  160-189  mg/dL   High  >190     mg/dL   Very High Performed at [place] [address], [place], [place] [address]    TSH 09/12/2021 3.302  0.350 - 4.500 uIU/mL Final   Comment: Performed by a 3rd Generation assay with a functional sensitivity of <=0.01 uIU/mL. Performed at [facility], [place] [address] [place], [place] [address]    POC Amphetamine UR 09/12/2021 None Detected  NONE DETECTED (Cut Off Level 1000 ng/mL) Final   POC Secobarbital (BAR) 09/12/2021 None Detected  NONE DETECTED (Cut Off Level 300 ng/mL) Final   POC Buprenorphine (BUP) 09/12/2021 None Detected  NONE DETECTED (Cut Off Level 10 ng/mL) Final   POC Oxazepam (BZO) 09/12/2021 None Detected  NONE DETECTED (Cut Off Level 300 ng/mL) Final   POC Cocaine UR 09/12/2021 None Detected  NONE DETECTED (Cut Off Level 300 ng/mL) Final   POC Methamphetamine UR 09/12/2021 None Detected  NONE DETECTED (Cut Off Level  1000 ng/mL) Final   POC Morphine 09/12/2021 None Detected  NONE DETECTED (Cut Off Level 300 ng/mL) Final   POC Methadone UR 09/12/2021 None Detected  NONE DETECTED (Cut Off Level 300 ng/mL) Final   POC Oxycodone UR 09/12/2021 None Detected  NONE DETECTED (Cut Off Level 100 ng/mL) Final   POC Marijuana UR 09/12/2021 None Detected  NONE DETECTED (  Cut Off Level 50 ng/mL) Final  Hospital Outpatient Visit on 09/12/2021  Component Date Value Ref Range Status   SARS Coronavirus 2 by RT PCR 09/12/2021 NEGATIVE  NEGATIVE Final   Comment: (NOTE) SARS-CoV-2 target nucleic acids are NOT DETECTED.  The SARS-CoV-2 RNA is generally detectable in upper and lower respiratory specimens during the acute phase of infection. The lowest concentration of SARS-CoV-2 viral copies this assay can detect is 250 copies / mL. A negative result does not preclude SARS-CoV-2 infection and should not be used as the sole basis for treatment or other patient management decisions.  A negative result may occur with improper specimen collection / handling, submission of specimen other than nasopharyngeal swab, presence of viral mutation(s) within the areas targeted by this assay, and inadequate number of viral copies (<250 copies / mL). A negative result must be combined with clinical observations, patient history, and epidemiological information.  Fact Sheet for Patients:   https://www.patel.info/  Fact Sheet for Healthcare Providers: https://hall.com/  This test is not yet approved or                           cleared by the [place] FDA and has been authorized for detection and/or diagnosis of SARS-CoV-2 by FDA under an Emergency Use Authorization (EUA).  This EUA will remain in effect (meaning this test can be used) for the duration of the COVID-19 declaration under Section 564(b)(1) of the Act, 21 U.S.C. section 360bbb-3(b)(1), unless the authorization is terminated  or revoked sooner.  Performed at [facility], [place] [address], [place], [place] [address]   Office Visit on 09/11/2021  Component Date Value Ref Range Status   Estradiol 09/11/2021 219 (H)  < OR = 39 pg/mL Final   Comment: Reference range established on post-pubertal patient population. No pre-pubertal reference range established using this assay. For any patients for whom low Estradiol levels are anticipated (e.g. males, pre-pubertal children and hypogonadal/post-menopausal  females), the [employer] Estradiol, Ultrasensitive, LCMSMS assay is recommended (order code [id number]). . Please note: patients being treated with the drug  fulvestrant (Faslodex(R)) have demonstrated significant  interference in immunoassay methods for estradiol  measurement. The cross reactivity could lead to falsely  elevated estradiol test results leading to an  inappropriate clinical assessment of estrogen status. [facility] order code [id number]-Estradiol,  Ultrasensitive LC/MS/MS demonstrates negligible cross  reactivity with fulvestrant.    Color, UA 09/11/2021 yellow   Final   Clarity, UA 09/11/2021 clear   Final   Glucose, UA 09/11/2021 Negative  Negative Final   Bilirubin, UA 09/11/2021 neg   Final   Ketones, UA 09/11/2021 neg   Final   Spec Grav, UA 09/11/2021 1.010  1.010 - 1.025 Final   Blood, UA 09/11/2021 neg   Final   pH, UA 09/11/2021 5.0  5.0 - 8.0 Final   Protein, UA 09/11/2021 Positive (A)  Negative Final   Urobilinogen, UA 09/11/2021 0.2  0.2 or 1.0 E.U./dL Final   Nitrite, UA 09/11/2021 neg   Final   Leukocytes, UA 09/11/2021 Negative  Negative Final    Allergies: Patient has no known allergies.  PTA Medications: (Not in a hospital admission)   Medical Decision Making  Patient will be admitted to [facility] for continuous assessment with follow-up by psychiatry on 01/24/2022 Lab Orders         Resp Panel by RT-PCR (Flu A&B, Covid)  Anterior Nasal Swab  CBC with Differential/Platelet         Comprehensive metabolic panel         Ethanol         TSH         POCT Urine Drug Screen - (I-Screen)         POC SARS Coronavirus 2 Ag        Recommendations  Based on my evaluation the patient does not appear to have an emergency medical condition.  Ophelia Shoulder, NP 01/24/22  3:25 AM

## 2022-01-24 NOTE — ED Provider Notes (Signed)
FBC/OBS ASAP Discharge Summary  Date and Time: 01/24/2022 9:22 AM  Name: Scott [name]  MRN:  [id number]   Discharge Diagnoses:  Final diagnoses:  Severe episode of recurrent major depressive disorder, without psychotic features ([place])   Subjective:   On reassessment pt reports presenting to this facility yesterday following an argument with her father. Pt reports she feels that her father "taunts" her when she is upset. She states this triggered suicidal ideation yesterday with fleeting thoughts of jumping off a bridge. Pt denies this was a plan or she had intent to act on this. She states today suicidal ideation has resolved. She verbally contracts to safety. She denies homicidal or violent ideation. She reports "pretty good" euthymic mood, "4 out of 5" with 5 being the best. She reports good appetite, eating 3 meals/day, and good sleep, sleeping 9 hours/night. Pt denies auditory visual hallucinations or paranoia.   Pt reports poor familial support, although reports strong support in her partner, as well as her close friend, Priscella Mann, who she has known for several years. She states Isaias and her have been planning for her to move to live with him in [place]. Priscella Mann will have his friend pick pt up and drive her to [place] some time next week.   Pt is followed by outpatient medication management by [facility] and with counseling at [facility].   Pt denies access to a firearm or other weapon.   Pt is currently living with father and sister (65 y/o). She does not feel that father or sister are supportive.  Pt gives verbal consent for me to call Isaias and provides best contact information [phone]. Isiais confirms plan for pt to be picked up next week, likely Monday, and to move in with him in [place]. Isiais confirms that he and pt have close relationship and are support for each other. He denies safety concerns with discharge today. He states he will continue to check in on pt.   Stay  Summary:  Pt is a 21 y/o transgender male presenting to [facility]  on 01/24/22 as a transfer from [facility] where pt was seen as a walk-in. Pt had presented with SI with plan to jump off a bridge. Pt was able to verbally contract to safety at that time. Pt with recent psychiatric hospitalization at [facility] from 01/14/22-01/18/22, with diagnoses of ADHD, MDD, PTSD, GAD. On reassessment, pt denies SI/HI/VI, AVH, paranoia. She verbally contracts to safety and states she has a plan to move out of her father's home next week. She has established outpatient medication management and counseling services and agrees to follow up outpatient.  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    Past Surgical History:  Procedure Laterality Date   ADENOIDECTOMY     EYE MUSCLE SURGERY Bilateral    x3   MYRINGOTOMY WITH TUBE PLACEMENT Bilateral    TONSILLECTOMY AND ADENOIDECTOMY Bilateral 02/03/2013   Procedure: TONSILLECTOMY AND ADENOIDECTOMY;  Surgeon: Jerrell Belfast, MD;  Location: [facility];  Service: ENT;  Laterality: Bilateral;   Family History:  Family History  Problem Relation  Age of Onset   Mental illness Mother    Bipolar disorder Mother    Personality disorder Mother    Alcohol abuse Mother    Drug abuse Mother    Skin cancer Maternal Grandmother    Mental illness Paternal Grandmother    Hepatitis C Paternal Grandfather    Cirrhosis Paternal Grandfather    Heart attack Paternal Grandfather    Family Psychiatric History: Reported family history. Mother diagnosed with  borderline personality disorder and bipolar disorder. 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    He has graduated from high school   Recently quit working at [employer] Strain: Not on file  Food Insecurity: Food Insecurity Present (01/14/2022)   Hunger Vital Sign    Worried About Running Out of Food in the Last Year: Sometimes true    Ran Out of Food in the Last Year: Never true  Transportation Needs: Unmet Transportation Needs (01/14/2022)   PRAPARE - Hydrologist (Medical): Yes    Lack of Transportation (Non-Medical): Yes  Physical Activity: Not on file  Stress: Not on file  Social Connections: Not on file   SDOH:  [place] Present (01/14/2022)  Housing: Medium Risk (01/14/2022)   Transportation Needs: Unmet Transportation Needs (01/14/2022)  Utilities: Not At Risk (01/14/2022)  Alcohol Screen: Low Risk  (01/14/2022)  Depression (PHQ2-9): High Risk (01/13/2022)  Tobacco Use: Medium Risk (01/14/2022)    Tobacco Cessation:  N/A, patient does not currently use tobacco products  Current Medications:  Current Facility-Administered Medications  Medication Dose Route Frequency Provider Last Rate Last Admin   acetaminophen (TYLENOL) tablet 650 mg  650 mg Oral Q6H PRN Ajibola, Ene A, NP       alum & mag hydroxide-simeth (MAALOX/MYLANTA) 200-200-20 MG/5ML suspension 30 mL  30 mL Oral Q4H PRN Ajibola, Ene A, NP       ARIPiprazole (ABILIFY) tablet 7.5 mg  7.5 mg Oral QHS Ajibola, Ene A, NP       hydrOXYzine (ATARAX) tablet 25 mg  25 mg Oral TID PRN Ajibola, Ene A,  NP   25 mg at 01/24/22 0858   magnesium hydroxide (MILK OF MAGNESIA) suspension 30 mL  30 mL Oral Daily PRN Ajibola, Ene A, NP       pantoprazole (PROTONIX) EC tablet 40 mg  40 mg Oral Daily Ajibola, Ene A, NP   40 mg at 01/24/22 0858   progesterone (PROMETRIUM) capsule 100 mg  100 mg Oral Daily Ajibola, Ene A, NP   100 mg at 01/24/22 0857   propranolol (INDERAL) tablet 10 mg  10 mg Oral QHS Ajibola, Ene A, NP       traZODone (DESYREL) tablet 50 mg  50 mg Oral QHS PRN Ajibola, Ene A, NP   50 mg at 01/23/22 2311   venlafaxine XR (EFFEXOR-XR) 24 hr capsule 150 mg  150 mg Oral Q breakfast Ajibola, Ene A, NP   150 mg at 01/24/22 0809   Current Outpatient Medications  Medication Sig Dispense Refill   ARIPiprazole (ABILIFY) 15 MG tablet Take 0.5 tablets (7.5 mg total) by mouth at bedtime. For depression 30 tablet 0   Estradiol Valerate 10 MG/ML OIL INJECT 0.3 ML (3 MG) ONCE WEEKLY INTO THE MUSCLE AS DIRECTED 5 mL 1   fluticasone (FLONASE) 50 MCG/ACT nasal spray Place 1 spray into both nostrils daily as needed for allergies.     hydrOXYzine (ATARAX) 25 MG tablet Take 1 tablet (25 mg total) by mouth every 6 (six) hours as  needed for anxiety. 75 tablet 0   pantoprazole (PROTONIX) 40 MG tablet Take 1 tablet (40 mg total) by mouth daily. For acid reflux 30 tablet 0   progesterone (PROMETRIUM) 100 MG capsule Take 1 capsule (100 mg total) by mouth daily. 30 capsule 3   propranolol (INDERAL) 10 MG tablet Take 1 tablet (10 mg total) by mouth at bedtime. For elevated pulse rate. 30 tablet 0   traZODone (DESYREL) 100 MG tablet Take 1 tablet (100 mg total) by mouth at bedtime. For sleep 30 tablet 0   triamcinolone ointment (KENALOG) 0.5 % Apply 1 application. topically 2 (two) times daily. 60 g 3   venlafaxine XR (EFFEXOR-XR) 150 MG 24 hr capsule Take 1 capsule (150 mg total) by mouth daily with breakfast. For depression 30 capsule 0    PTA Medications: (Not in a hospital admission)      01/13/2022    6:24 PM 08/29/2021   10:15 AM 07/22/2021    2:48 PM  Depression screen PHQ 2/9  Decreased Interest 3 2 1   Down, Depressed, Hopeless 3 1 2   PHQ - 2 Score 6 3 3   Altered sleeping 3 1 2   Tired, decreased energy 3 1 1   Change in appetite 3 3 2   Feeling bad or failure about yourself  3 3 3   Trouble concentrating 3 3 2   Moving slowly or fidgety/restless 0 3 0  Suicidal thoughts 3 0   PHQ-9 Score 24 17 13   Difficult doing work/chores Extremely dIfficult Extremely dIfficult     Flowsheet Row ED from 01/23/2022 in [facility] Most recent reading at 01/23/2022 11:57 PM OP Visit from 01/23/2022 in [place] Most recent reading at 01/23/2022  9:15 PM Admission (Discharged) from 01/14/2022 in [facility] Most recent reading at 01/14/2022 12:15 AM  C-SSRS RISK CATEGORY High Risk High Risk High Risk       Musculoskeletal  Strength & Muscle Tone: within normal limits Gait & Station: normal Patient leans: N/A  Psychiatric Specialty Exam  Presentation  General Appearance:  Appropriate for Environment  Eye  Contact: Good  Speech: Clear and Coherent  Speech Volume: Normal  Handedness: Right   Mood and Affect  Mood: Depressed  Affect: Congruent   Thought Process  Thought Processes: Coherent  Descriptions of Associations:Intact  Orientation:Full (Time, Place and Person)  Thought Content:WDL  Diagnosis of Schizophrenia or Schizoaffective disorder in past: No    Hallucinations:Hallucinations: None  Ideas of Reference:None  Suicidal Thoughts:Suicidal Thoughts: No  Homicidal Thoughts:Homicidal Thoughts: No   Sensorium  Memory: Immediate Good; Recent Good; Remote Fair  Judgment: Fair  Insight: Good   Executive Functions  Concentration: Good  Attention Span: Good  Recall: [place] of Knowledge: Good  Language: Good   Psychomotor Activity  Psychomotor Activity: Psychomotor Activity: Normal   Assets  Assets: Desire for Improvement; Communication Skills; Physical Health   Sleep  Sleep: Sleep: Good   Nutritional Assessment (For OBS and FBC admissions only) Has the patient had a weight loss or gain of 10 pounds or more in the last 3 months?: No Has the patient had a decrease in food intake/or appetite?: No Does the patient have dental problems?: No Does the patient have eating habits or behaviors that may be indicators of an eating disorder including binging or inducing vomiting?: No Has the patient recently lost weight without trying?: 0 Has the patient been eating poorly because of a decreased appetite?: 0 Malnutrition Screening Tool Score: 0    Physical Exam  Physical Exam Constitutional:      General: She is not in acute distress.    Appearance: She is not ill-appearing, toxic-appearing or diaphoretic.  Eyes:     General: No scleral icterus. Cardiovascular:     Rate and Rhythm: Normal rate.  Pulmonary:     Effort: Pulmonary effort is normal. No respiratory distress.  Neurological:     Mental Status: She is alert and  oriented to person, place, and time.  Psychiatric:        Attention and Perception: Attention and perception normal.        Mood and Affect: Mood and affect normal.        Speech: Speech normal.        Behavior: Behavior normal. Behavior is cooperative.        Thought Content: Thought content normal.    Review of Systems  Constitutional:  Negative for chills and fever.  Respiratory:  Negative for shortness of breath.   Cardiovascular:  Negative for chest pain and palpitations.  Gastrointestinal:  Negative for abdominal pain.  Neurological:  Negative for headaches.   Blood pressure 121/76, pulse 97, temperature 98.2 F (36.8 C), temperature source Oral, resp. rate 18, SpO2 99 %. There is no height or weight on file to calculate BMI.  Demographic Factors:  Male, Adolescent or young adult, and Caucasian  Loss Factors: NA  Historical Factors: Prior suicide attempts, Family history of mental illness or substance abuse, and Impulsivity  Risk Reduction Factors:   Positive social support, Positive therapeutic relationship, and Positive coping skills or problem solving skills  Continued Clinical Symptoms:  Previous Psychiatric Diagnoses and Treatments  Cognitive Features That Contribute To Risk:  None    Suicide Risk:  Mild:  Suicidal ideation of limited frequency, intensity, duration, and specificity.  There are no identifiable plans, no associated intent, mild dysphoria and related symptoms, good self-control (both objective and subjective assessment), few other risk factors, and identifiable protective factors, including available and accessible social support.  Plan Of Care/Follow-up recommendations:  Follow up with outpatient psychiatry and counseling   Patient is instructed prior to discharge to: Take all medications as prescribed by his/her mental healthcare provider. Report any adverse effects and or reactions from the medicines to his/her outpatient provider  promptly. Keep all scheduled appointments, to ensure that you are getting refills on time and to avoid any interruption in your medication.  If you are unable to keep an appointment call to reschedule.  Be sure to follow-up with resources and follow-up appointments provided.  Patient has been instructed & cautioned: To not engage in alcohol and or illegal drug use while on prescription medicines. In the event of worsening symptoms, patient is instructed to call the crisis hotline, 911 and or go to the nearest ED for appropriate evaluation and treatment of symptoms. To follow-up with his/her primary care provider for your other medical issues, concerns and or health care needs.  Information: -National Suicide Prevention Lifeline 1-800-SUICIDE or 469-059-9052.  -988 offers 24/7 access to trained crisis counselors who can help people experiencing mental health-related distress. People can call or text 988 or chat 988lifeline.org for themselves or if they are worried about a loved one who may need crisis support.   Disposition:  Discharge   Lauree Chandler, NP 01/24/2022, 9:22 AM

## 2022-01-27 ENCOUNTER — Encounter (HOSPITAL_COMMUNITY): Payer: Self-pay | Admitting: Student in an Organized Health Care Education/Training Program

## 2022-01-27 ENCOUNTER — Telehealth (INDEPENDENT_AMBULATORY_CARE_PROVIDER_SITE_OTHER): Payer: Medicaid Other | Admitting: Student in an Organized Health Care Education/Training Program

## 2022-01-27 DIAGNOSIS — F330 Major depressive disorder, recurrent, mild: Secondary | ICD-10-CM

## 2022-01-27 DIAGNOSIS — F902 Attention-deficit hyperactivity disorder, combined type: Secondary | ICD-10-CM

## 2022-01-27 MED ORDER — DEXMETHYLPHENIDATE HCL 10 MG PO TABS: 20.0000 mg | ORAL_TABLET | Freq: Every day | ORAL | 0 refills | Status: DC

## 2022-01-27 MED ORDER — GUANFACINE HCL ER 2 MG PO TB24: 2.0000 mg | ORAL_TABLET | Freq: Every day | ORAL | 1 refills | Status: DC

## 2022-01-27 MED ORDER — TRAZODONE HCL 100 MG PO TABS: 100.0000 mg | ORAL_TABLET | Freq: Every day | ORAL | 1 refills | Status: DC

## 2022-01-27 MED ORDER — ARIPIPRAZOLE 15 MG PO TABS: 7.5000 mg | ORAL_TABLET | Freq: Every day | ORAL | 1 refills | Status: DC

## 2022-01-27 MED ORDER — DESVENLAFAXINE SUCCINATE ER 100 MG PO TB24: 100.0000 mg | ORAL_TABLET | Freq: Every day | ORAL | 3 refills | Status: DC

## 2022-01-27 NOTE — Progress Notes (Signed)
[place] MD/PA/NP OP Progress Note  01/27/2022 11:32 AM Scott [name]  MRN:  [id number]  Chief Complaint:  Chief Complaint  Patient presents with   Follow-up   Virtual Visit via Video Note  I connected with Scott [name] on 01/27/22 at 10:00 AM EST by a video enabled telemedicine application and verified that I am speaking with the correct person using two identifiers.  Location: Patient: Home Provider: Office   I discussed the limitations of evaluation and management by telemedicine and the availability of in person appointments. The patient expressed understanding and agreed to proceed.  History of Present Illness:  Scott [name] "Scott [name]" is a transgender male with a past psychiatric history significant for PTSD, major depressive disorder, generalized anxiety disorder, and attention deficit hyperactivity disorder (combined type).  Patient was recently discharged from [facility] 11/29 after being admitted due to [place].  Patient was discharged on Effexor XR 150 mg, Abilify 7.5 mg, trazodone 100 mg nightly with the recommendation to discontinue Focalin and Intuniv.  Patient reports that he has not been taking the Effexor XR and returned to taking his Pristiq 100 mg daily due to concern that the Effexor was elevating his heart rate.  Patient reports that he has been compliant with the Abilify 7.5 mg and trazodone 100 mg nightly.  Patient reports that he has been out of his Focalin 20 mg daily for some time but has been compliant with his previously prescribed Intuniv 2 mg daily.  Patient reports that despite initially planning to move to [place] as recently as yesterday with an online friend, he has decided against this.  Patient reports that he and his father had a conversation where it was recommended that he not leave the state due to insurance not transferring.  Patient endorsed that he agreed with this plan as he knew he would not be able to afford his medications when he moved to [place].   Patient reports that he believes his hospitalization was triggered by the tumultuous relationship with his father.  Patient reports that he believes his father has been "bullying me."  Patient reports that he feels invalidated by his father's comments and actions.  Patient reports that the hospitalization occurred after he had an online friend know that he did not feel "in my right mind."  Patient reports he was having SI but did not act on the SI instead telling someone his thoughts.  Patient reports that he went to the urgent care and was later admitted to the hospital.  Patient agrees that he has not been as impulsive lately but is struggling with feeling "secure."  Patient reports that since discharge he did return to the urgent care one other time, but lately he feels as though his mood has been a "7/10" with a 10 being good.  Patient denies SI, HI and AVH.  Patient reports he has been sleeping well and that his appetite is stable.  Patient endorses that he has a virtual support group of a girlfriend in [place], friends that he has online as well as some people he met during his recent hospitalization.  Patient endorses that these people including his joy in music, hoping that his relationship with his father 1 day improved, and feeling as though he needs to be better to the people he may have hurt in the past or reasons against him wanting to act on SI at any time.  Patient reports as a result of the difficult month he was having prior  to the hospitalization he did quit his job.  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  Patient reports that he is struggling with concentration and endorses he has not had his Focalin in quite some time.  Patient reports that his focus is very poor and he will often have a multitude of thoughts at 1 time.  Patient reports that he cannot even focus on talking with friends or watching  YouTube.  Patient reports he will often go down rabbit holes on YouTube due to his poor focus.  Patient reports that he is struggling getting the few jobs that he does have contracted done in time.  Patient and provider also briefly discussed that patient had an elevated TSH at his recent urgent care visit, patient endorsed that he will attempt to get a new primary care provider to investigate this and endorses that he does believe this may be contributing as well to his more dysphoric mood over the past few months.    I discussed the assessment and treatment plan with the patient. The patient was provided an opportunity to ask questions and all were answered. The patient agreed with the plan and demonstrated an understanding of the instructions.   The patient was advised to call back or seek an in-person evaluation if the symptoms worsen or if the condition fails to improve as anticipated.  I provided 30 minutes of non-face-to-face time during this encounter.   Freida Busman, MD  Visit Diagnosis:    ICD-10-CM   1. ADHD (attention deficit hyperactivity disorder), combined type  F90.2 dexmethylphenidate (FOCALIN) 10 MG tablet    guanFACINE (INTUNIV) 2 MG TB24 ER tablet    2. 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  Patient appeared stable at this visit.  Patient did endorse that he was having more issues with his father and was planning to move out in the next 6 months to [place] with a friend.  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    Past Surgical History:  Procedure Laterality Date   ADENOIDECTOMY     EYE MUSCLE SURGERY Bilateral    x3   MYRINGOTOMY WITH TUBE PLACEMENT Bilateral    TONSILLECTOMY AND ADENOIDECTOMY Bilateral 02/03/2013   Procedure: TONSILLECTOMY AND ADENOIDECTOMY;  Surgeon: Jerrell Belfast, MD;  Location: [facility];  Service: ENT;  Laterality: Bilateral;    Family Psychiatric History: Father - ADHD Mother - bipolar disorder and borderline personality disorder  Family History:  Family History  Problem Relation Age of Onset   Mental illness Mother    Bipolar disorder Mother    Personality disorder Mother    Alcohol abuse Mother  Drug abuse Mother    Skin cancer Maternal Grandmother    Mental illness Paternal Grandmother    Hepatitis C Paternal Grandfather    Cirrhosis Paternal Grandfather    Heart attack Paternal Grandfather     Social History:  Social History   Socioeconomic History   Marital status: Single    Spouse name: Not on file   Number of children: Not on file   Years of education: Not on file   Highest education level: High school graduate  Occupational History   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    He has graduated from high school   Recently quit working at [employer] Strain: Not on file  Food Insecurity: Food Insecurity Present (01/14/2022)   Hunger Vital Sign    Worried About Running Out of Food in the Last Year: Sometimes true    Ran Out of Food in the Last Year: Never true  Transportation Needs: Unmet Transportation Needs (01/14/2022)   PRAPARE - Hydrologist (Medical): Yes    Lack of Transportation (Non-Medical): Yes  Physical Activity: Not on file  Stress: Not on file  Social Connections: Not on file    Allergies: No Known Allergies  Metabolic Disorder Labs: Lab Results  Component Value Date   HGBA1C 5.3 01/13/2022   MPG 105 01/13/2022   MPG 91.06 09/12/2021   Lab Results  Component Value Date   PROLACTIN 6.4 07/09/2020   PROLACTIN 22.4 (H) 04/02/2020   Lab Results  Component Value Date   CHOL 202 (H) 01/13/2022   TRIG 147 01/13/2022   HDL 30 (L) 01/13/2022   CHOLHDL 6.7 01/13/2022   VLDL 29 01/13/2022   LDLCALC 143 (H) 01/13/2022   LDLCALC 132 (H) 09/12/2021   Lab Results  Component Value Date   TSH 5.194 (H) 01/23/2022   TSH 4.557 (H) 01/13/2022    Therapeutic Level Labs: No results found for: "LITHIUM" No results found for: "VALPROATE" No results found for: "CBMZ"  Current Medications: Current Outpatient Medications  Medication Sig Dispense Refill   desvenlafaxine (PRISTIQ) 100 MG 24 hr tablet Take 1 tablet (100 mg total) by mouth daily. 30 tablet 3   dexmethylphenidate (FOCALIN) 10 MG tablet Take 2 tablets (20 mg total) by mouth daily. 60 tablet 0   guanFACINE (INTUNIV) 2 MG TB24 ER tablet Take 1 tablet (2 mg total) by mouth daily. 30 tablet 1   ARIPiprazole (ABILIFY) 15 MG tablet Take 0.5 tablets (7.5 mg total) by mouth at  bedtime. For depression 30 tablet 1   Estradiol Valerate 10 MG/ML OIL INJECT 0.3 ML (3 MG) ONCE WEEKLY INTO THE MUSCLE AS DIRECTED 5 mL 1   fluticasone (FLONASE) 50 MCG/ACT  nasal spray Place 1 spray into both nostrils daily as needed for allergies.     hydrOXYzine (ATARAX) 25 MG tablet Take 1 tablet (25 mg total) by mouth every 6 (six) hours as needed for anxiety. 75 tablet 0   pantoprazole (PROTONIX) 40 MG tablet Take 1 tablet (40 mg total) by mouth daily. For acid reflux 30 tablet 0   progesterone (PROMETRIUM) 100 MG capsule Take 1 capsule (100 mg total) by mouth daily. 30 capsule 3   propranolol (INDERAL) 10 MG tablet Take 1 tablet (10 mg total) by mouth at bedtime. For elevated pulse rate. 30 tablet 0   traZODone (DESYREL) 100 MG tablet Take 1 tablet (100 mg total) by mouth at bedtime. For sleep 30 tablet 1   triamcinolone ointment (KENALOG) 0.5 % Apply 1 application. topically 2 (two) times daily. 60 g 3   No current facility-administered medications for this visit.     Musculoskeletal: defer  Psychiatric Specialty Exam: Review of Systems  Psychiatric/Behavioral:  Negative for hallucinations and suicidal ideas. The patient is not nervous/anxious.     There were no vitals taken for this visit.There is no height or weight on file to calculate BMI.  General Appearance: Casual  Eye Contact:  Good  Speech:  Clear and Coherent  Volume:  Normal  Mood:  Euthymic  Affect:  Appropriate  Thought Process:  Coherent  Orientation:  Full (Time, Place, and Person)  Thought Content: Logical   Suicidal Thoughts:  No  Homicidal Thoughts:  No  Memory:  Immediate;   Good Recent;   Good  Judgement:  Fair  Insight:  Shallow  Psychomotor Activity:  Normal  Concentration:  Concentration: Fair  Recall:  NA  Fund of Knowledge: Good  Language: Good  Akathisia:    Handed:    AIMS (if indicated): not done  Assets:  Communication Skills Desire for Improvement Housing Leisure Time Resilience   ADL's:  Intact  Cognition: WNL  Sleep:  Fair   Screenings: AIMS    Flowsheet Row Admission (Discharged) from 01/28/2021 in [facility]  AIMS Total Score 0      AUDIT    Flowsheet Row Admission (Discharged) from 01/14/2022 in [facility] Admission (Discharged) from 01/28/2021 in [facility]  Alcohol Use Disorder Identification Test Final Score (AUDIT) 0 0      GAD-7    Flowsheet Row Office Visit from 07/22/2021 in [facility] Office Visit from 07/17/2021 in [facility] Office Visit from 05/27/2021 in [facility] [facility] Counselor from 05/06/2021 in [facility] Office Visit from 04/29/2021 in [facility] [facility]  Total GAD-7 Score _0 PHQ2-9    [place] ED from 01/13/2022 in [facility] Video Visit from 08/29/2021 in [facility] Office Visit from 07/22/2021 in [facility] [place] [facility] [place] [facility] Office Visit from 07/17/2021 in [facility] Counselor from 05/28/2021 in [place]  PHQ-2 Total Score _1 PHQ-9 Total Score _2 [facility] ED from 01/23/2022 in [facility] Most recent reading at 01/23/2022 11:57 PM OP Visit from 01/23/2022  in [place] Most recent reading at 01/23/2022  9:15 PM Admission (Discharged) from 01/14/2022 in [facility] Most recent reading at 01/14/2022 12:15 AM  C-SSRS RISK CATEGORY High Risk High Risk High Risk        Assessment and Plan: Scott [name] "Scott [name]" is a transgender male with a past  psychiatric history significant for PTSD, major depressive disorder, generalized anxiety disorder, and attention deficit hyperactivity disorder (combined type).  Patient's ADHD diagnosis was confirmed on chart review as he was previously seen at the [facility] where he was also tested.  Patient has currently aged out of his pediatrician's office and is still in the process of finding a PCP, will restart patient's Focalin however had a conversation with the patient that he has had decreased impulsivity since not being on the medication.  Patient endorses that he has also been proud of himself for not acting irrationally or impulsively, but does feel that his productivity and thought process have significantly declined.  Patient reported that should he start feeling more impulsive he will discontinue medication and call back.  Patient was educated that there may be other options to treat his ADHD.  In regards to patient's recent hospitalization, it does appear to be more related to external relationship factors secondary to patient's borderline personality disorder.  Patient's elevated TSH may also be contributing to patient suddenly becoming more upset leading to letting someone else know he was having SI.  However, this is an improvement that patient did not act on his thoughts at all.  Borderline personality disorder MDD, recurrent, mild  PTSD (stable) - Continue taking Abilify 7.5 mg nightly or daily depending on patient's response and compliance - Continue taking Pristiq 100 mg daily - Continue Trazodone 174m QHS  GAD - Continue hydroxyzine 25 mg every 6 hours as needed - Pristiq 100 mg daily  ADHD - Continue Intuniv 2 mg T24 ER daily - Continue Focalin 20 mg daily  Concern for hypothyroidism - [facility]  Collaboration of Care: Collaboration of Care:   Patient/Guardian was advised Release of Information must be obtained prior to any  record release in order to collaborate their care with an outside provider. Patient/Guardian was advised if they have not already done so to contact the registration department to sign all necessary forms in order for [facility] release information regarding their care.   Consent: Patient/Guardian gives verbal consent for treatment and assignment of benefits for services provided during this visit. Patient/Guardian expressed understanding and agreed to proceed.   PGY-3 JFreida Busman MD 01/27/2022, 11:32 AM

## 2022-02-05 ENCOUNTER — Encounter (HOSPITAL_COMMUNITY): Payer: Self-pay

## 2022-02-05 ENCOUNTER — Ambulatory Visit (HOSPITAL_COMMUNITY): Payer: Medicaid Other | Admitting: Clinical

## 2022-03-06 ENCOUNTER — Encounter (HOSPITAL_COMMUNITY): Payer: Self-pay | Admitting: Student in an Organized Health Care Education/Training Program

## 2022-03-06 ENCOUNTER — Telehealth (INDEPENDENT_AMBULATORY_CARE_PROVIDER_SITE_OTHER): Payer: Medicaid Other | Admitting: Student in an Organized Health Care Education/Training Program

## 2022-03-06 DIAGNOSIS — F330 Major depressive disorder, recurrent, mild: Secondary | ICD-10-CM | POA: Diagnosis not present

## 2022-03-06 DIAGNOSIS — F902 Attention-deficit hyperactivity disorder, combined type: Secondary | ICD-10-CM | POA: Diagnosis not present

## 2022-03-06 MED ORDER — TRAZODONE HCL 100 MG PO TABS: 100.0000 mg | ORAL_TABLET | Freq: Every day | ORAL | 1 refills | Status: DC

## 2022-03-06 MED ORDER — GUANFACINE HCL ER 2 MG PO TB24: 2.0000 mg | ORAL_TABLET | Freq: Every day | ORAL | 3 refills | Status: DC

## 2022-03-06 MED ORDER — ARIPIPRAZOLE 5 MG PO TABS: 5.0000 mg | ORAL_TABLET | Freq: Every day | ORAL | 3 refills | Status: DC

## 2022-03-06 MED ORDER — DEXMETHYLPHENIDATE HCL 10 MG PO TABS: 20.0000 mg | ORAL_TABLET | Freq: Every day | ORAL | 0 refills | Status: DC

## 2022-03-06 MED ORDER — DESVENLAFAXINE SUCCINATE ER 100 MG PO TB24: 100.0000 mg | ORAL_TABLET | Freq: Every day | ORAL | 3 refills | Status: DC

## 2022-03-06 NOTE — Progress Notes (Signed)
BH MD/PA/NP OP Progress Note  03/06/2022 3:50 PM Scott [name]  MRN:  [id number]  Chief Complaint:  Chief Complaint  Patient presents with   Follow-up   Virtual Visit via Video Note  I connected with Scott [name] on 03/06/22 at  8:30 AM EST by a video enabled telemedicine application and verified that I am speaking with the correct person using two identifiers.  Location: Patient: Home Provider: Office   I discussed the limitations of evaluation and management by telemedicine and the availability of in person appointments. The patient expressed understanding and agreed to proceed.  History of Present Illness:   Scott [name] "Scott [name]" is a transgender male with a past psychiatric history significant for PTSD, major depressive disorder, generalized anxiety disorder, and attention deficit hyperactivity disorder (combined type).  Patient was recently discharged from [facility] 11/29 after being admitted due to SI. Patient reports compliance with the following medications:   Abilify 7.5mg  QHS Trazodone 100mg  QHS Hydroxyzine 25mg  PRN  Patient reports that she ran out of Pristiq, 2 weeks ago but found it helpful until he ran out. Patient reports she has not been able to get Focalin due to the medication shortage. Patient reports that she has been using her Intuniv every other day to help with lack of concentration while not having Focalin. Patient reports that he is afraid to run out. Patient reports he was afraid that because the medicine was  for her ADHD it was also part of the shortage. 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   Patient reports that she is only requiring Hydroxyzine at min every other week. Patient reports that he is still has occasional panic attacks triggered by being in a financially stressful situation. However, she feels that they don't last long and is  able to use coping skills. Patient reports that she is not sleeping well. Patient reports that she is having problems staying asleep, and is not taking the Trazodone every night. Patient reports that when they take the Trazodone she does get a bit more sleep but still wakes up too early and is not very well rested. Patient endorses feeling tense at night and during the day and endorses that this is contributing to lack of sleep. Patient does feel irritable but is not getting into arguments.   Patient reports that overall mood has been "pretty good" he does feel like he has been eating too much. Patient denies constat anxiety. Patient denies SI, HI, and AVH.    I discussed the assessment and treatment plan with the patient. The patient was provided an opportunity to ask questions and all were answered. The patient agreed with the plan and demonstrated an understanding of the instructions.   The patient was advised to call back or seek an in-person evaluation if the symptoms worsen or if the condition fails to improve as anticipated.  I provided 30 minutes of non-face-to-face time during this encounter.   , MD  Visit Diagnosis:    ICD-10-CM   1. MDD (major depressive disorder), recurrent episode, mild (HCC)  F33.0 ARIPiprazole (ABILIFY) 5 MG tablet    desvenlafaxine (PRISTIQ) 100 MG 24 hr tablet    traZODone (DESYREL) 100 MG tablet    2. ADHD (attention deficit hyperactivity disorder), combined type  F90.2 dexmethylphenidate (FOCALIN) 10 MG tablet    guanFACINE (INTUNIV) 2 MG TB24 ER  tablet      Past Psychiatric History:    Borderline Personality Disorder, (10/07/2021) Major depressive disorder ADHD PTSD    Last visit: 01/2022-patient recently discharged from psychiatric hospital with medications adjusted.  Continued medications at adjusted dosages Abilify 7.5, Pristiq 100 mg daily, trazodone 100 mg nightly and hydroxyzine 25 every 6 as needed as well as Intuniv 2 mg daily  and Focalin 20 mg daily  11/2021: Patient continued on Pristiq 100 mg, Abilify 5 mg, trazodone 100 mg nightly and his PCP prescribed Intuniv 2 mg and Focalin 20 mg.  Patient appeared stable at this visit.  Patient did endorse that he was having more issues with his father and was planning to move out in the next 6 months to [place] with a friend.  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    Past Surgical History:  Procedure Laterality Date   ADENOIDECTOMY     EYE MUSCLE SURGERY Bilateral    x3   MYRINGOTOMY WITH TUBE PLACEMENT Bilateral    TONSILLECTOMY AND ADENOIDECTOMY Bilateral 02/03/2013   Procedure: TONSILLECTOMY AND ADENOIDECTOMY;  Surgeon: Osborn Coho, MD;  Location: [facility];  Service: ENT;  Laterality: Bilateral;    Family Psychiatric History:  Father - ADHD Mother - bipolar disorder and borderline personality disorder  Family History:  Family History  Problem Relation Age of Onset   Mental illness Mother    Bipolar disorder Mother    Personality disorder Mother    Alcohol abuse Mother    Drug abuse Mother    Skin cancer Maternal Grandmother    Mental illness Paternal Grandmother    Hepatitis C Paternal Grandfather    Cirrhosis Paternal Grandfather    Heart attack Paternal Grandfather     Social History:  Social History   Socioeconomic History   Marital status: Single    Spouse name: Not on file   Number of children: Not on file   Years of education: Not on file   Highest education level: High school graduate  Occupational History   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    He has graduated from high school   Recently quit working at [employer]: Not on file  Food Insecurity: Food Insecurity Present (01/14/2022)   Hunger Vital Sign    Worried About Running Out of Food in the Last Year: Sometimes true    Ran Out of Food in the Last Year:  Never true  Transportation Needs: Unmet Transportation Needs (01/14/2022)   PRAPARE - Hydrologist (Medical): Yes    Lack of Transportation (Non-Medical): Yes  Physical Activity: Not on file  Stress: Not on file  Social Connections: Not on file    Allergies: No Known Allergies  Metabolic Disorder Labs: Lab Results  Component Value Date   HGBA1C 5.3 01/13/2022   MPG 105 01/13/2022   MPG 91.06 09/12/2021   Lab Results  Component Value Date   PROLACTIN 6.4 07/09/2020   PROLACTIN 22.4 (H) 04/02/2020   Lab Results  Component Value Date   CHOL 202 (H) 01/13/2022   TRIG 147 01/13/2022   HDL 30 (L) 01/13/2022   CHOLHDL 6.7 01/13/2022   VLDL 29 01/13/2022   LDLCALC 143 (H) 01/13/2022   LDLCALC 132 (H) 09/12/2021   Lab Results  Component Value Date   TSH 5.194 (H) 01/23/2022   TSH 4.557 (H) 01/13/2022    Therapeutic Level Labs: No results found for: "LITHIUM" No results found for: "VALPROATE" No results found for: "CBMZ"  Current Medications: Current Outpatient Medications  Medication Sig Dispense Refill   ARIPiprazole (ABILIFY) 5 MG  tablet Take 1 tablet (5 mg total) by mouth daily. For depression 30 tablet 3   desvenlafaxine (PRISTIQ) 100 MG 24 hr tablet Take 1 tablet (100 mg total) by mouth daily. 30 tablet 3   dexmethylphenidate (FOCALIN) 10 MG tablet Take 2 tablets (20 mg total) by mouth daily. 60 tablet 0   Estradiol Valerate 10 MG/ML OIL INJECT 0.3 ML (3 MG) ONCE WEEKLY INTO THE MUSCLE AS DIRECTED 5 mL 1   fluticasone (FLONASE) 50 MCG/ACT nasal spray Place 1 spray into both nostrils daily as needed for allergies.     guanFACINE (INTUNIV) 2 MG TB24 ER tablet Take 1 tablet (2 mg total) by mouth daily. 30 tablet 3   hydrOXYzine (ATARAX) 25 MG tablet Take 1 tablet (25 mg total) by mouth every 6 (six) hours as needed for anxiety. 75 tablet 0   pantoprazole (PROTONIX) 40 MG tablet Take 1 tablet (40 mg total) by mouth daily. For acid reflux 30 tablet 0   progesterone (PROMETRIUM) 100 MG capsule Take 1 capsule (100 mg total) by mouth daily. 30 capsule 3   propranolol (INDERAL) 10 MG tablet Take 1 tablet (10 mg total) by mouth at bedtime. For elevated pulse rate. 30 tablet 0   traZODone (DESYREL) 100 MG tablet Take 1 tablet (100 mg total) by mouth at bedtime. For sleep 30 tablet 1   triamcinolone ointment (KENALOG) 0.5 % Apply 1 application. topically 2 (two) times daily. 60 g 3   No current facility-administered medications for this visit.     Musculoskeletal: Defer  Psychiatric Specialty Exam: Review of Systems  Psychiatric/Behavioral:  Positive for decreased concentration and sleep disturbance. Negative for dysphoric mood, hallucinations and suicidal ideas.     There were no vitals taken for this visit.There is no height or weight on file to calculate BMI.  General Appearance: Casual  Eye Contact:  Good  Speech:  Clear and Coherent  Volume:  Normal  Mood:  Euthymic  Affect:  Appropriate  Thought Process:  Coherent  Orientation:  Full (Time, Place, and Person)  Thought Content: Logical   Suicidal Thoughts:  No   Homicidal Thoughts:  No  Memory:  Immediate;   Good Recent;   Good  Judgement:  Good  Insight:  Fair  Psychomotor Activity:  Normal  Concentration:  Concentration:  Poor  Recall:  NA  Fund of Knowledge: Good  Language: Good  Akathisia:  No  Handed:    AIMS (if indicated): not done  Assets:  Communication Skills Desire for Improvement Housing Resilience Social Support Vocational/Educational  ADL's:  Intact  Cognition: WNL  Sleep:  Poor   Screenings: AIMS    Flowsheet Row [facility]  AIMS Total Score 0      AUDIT    Flowsheet Row Ad[facility]) [facility] Admission (Discharged[facility]  Alcohol Use Disorder Identification Test Final Score (AUDIT) 0 0      GAD-7    Flowsheet Row Office Visit from 07/22/2021 in [place] [facility] Office Visit from 07/17/2021 in [facility] Office Visit from 05/27/2021 in [place] [facility] Counselor from 05/06/2021 in [facility] Office Visit from 04/29/2021 in [place] [facility]  Total GAD-7 Score 11 13 2 17 5       PHQ2-9    Flowsheet Row ED from 01/13/2022 in [facility] Video Visit from 08/29/2021 in [facility] Office Visit from 07/22/2021 in [place] [facility] Office Visit from 07/17/2021 in [facility] Counselor from 05/28/2021 in [facility]  PHQ-2 Total Score 6 3 3 3 1   PHQ-9 Total Score 24 17 13 17 14       Flowsheet Row ED from 01/23/2022 in [facility] Most recent reading at  01/23/2022 11:57 PM OP [facility] Most recent reading at 01/23/2022  9:15 PM Admission (Discharged) [facility] Most recent reading at 01/14/2022 12:15 AM  C-SSRS RISK CATEGORY High Risk High Risk High Risk        Assessment and Plan:   14/09/2021 "01/16/2022" is a transgender male with a past psychiatric history significant for PTSD, major depressive disorder, generalized anxiety disorder, and attention deficit hyperactivity disorder (combined type).  Patient's ADHD diagnosis was confirmed on chart review as he was previously seen at the [facility] where he was also tested.    TSH self corrected to 2.25 w/ Free T4 1.18 recommend patient still go to f/u due to rapid fluctuation and psychiatric destabilization that occurred with elevated TSH. Will decrease Abilify now that patient is doing better and is already endorsing increase appetite. Will also suggest taking in the AM as this may be part of why patient is "too activated" to get a good night rest.  Patient appears to get some benefit from his Intuniv for concentration but continues to endorse struggling completing tasks and easily feeling overwhelmed especially as he is struggling with getting things done on time, is often seen in ADHD patients.  Borderline personality disorder MDD, recurrent, mild  PTSD (stable) - Decrease Abilify to 5 mg daily, do not take at nighttime - Heart taking Pristiq 100 mg daily - Continue Trazodone 100mg  QHS  GAD - Continue hydroxyzine 25 mg every 6 hours as needed - Pristiq 100 mg daily  ADHD - Continue Intuniv 2 mg T24 ER daily - Continue Focalin 20 mg daily, if able  F/u in mid- April   Collaboration of Care: Collaboration of Care:   Patient/Guardian was advised Release of Information must be obtained prior to any record release in order to collaborate their care with an outside provider.  Patient/Guardian was advised if they have not already done so to contact the registration department to sign all necessary forms in order for [place] to release information regarding their care.   Consent: Patient/Guardian gives verbal consent for treatment and assignment of benefits for services provided during this visit. Patient/Guardian expressed understanding and agreed to proceed.    Freida Busman, MD 03/06/2022, 3:50 PM

## 2022-03-17 ENCOUNTER — Telehealth (HOSPITAL_COMMUNITY): Payer: Medicaid Other | Admitting: Student in an Organized Health Care Education/Training Program

## 2022-05-12 ENCOUNTER — Other Ambulatory Visit: Payer: Self-pay

## 2022-05-12 ENCOUNTER — Encounter (HOSPITAL_COMMUNITY): Payer: Self-pay

## 2022-05-12 ENCOUNTER — Emergency Department (HOSPITAL_COMMUNITY)
Admission: EM | Admit: 2022-05-12 | Discharge: 2022-05-12 | Disposition: A | Discharge status: Home / Self Care | Payer: Medicaid Other | Attending: Emergency Medicine | Admitting: Emergency Medicine

## 2022-05-12 DIAGNOSIS — R45851 Suicidal ideations: Secondary | ICD-10-CM

## 2022-05-12 DIAGNOSIS — R21 Rash and other nonspecific skin eruption: Secondary | ICD-10-CM | POA: Diagnosis not present

## 2022-05-12 DIAGNOSIS — F603 Borderline personality disorder: Secondary | ICD-10-CM | POA: Diagnosis present

## 2022-05-12 LAB — CBC WITH DIFFERENTIAL/PLATELET
Abs Immature Granulocytes: 0.04 10*3/uL (ref 0.00–0.07)
Basophils Absolute: 0 10*3/uL (ref 0.0–0.1)
Basophils Relative: 0 %
Eosinophils Absolute: 0.1 10*3/uL (ref 0.0–0.5)
Eosinophils Relative: 1 %
HCT: 40.4 % (ref 39.0–52.0)
Hemoglobin: 13.4 g/dL (ref 13.0–17.0)
Immature Granulocytes: 1 %
Lymphocytes Relative: 34 %
Lymphs Abs: 3 10*3/uL (ref 0.7–4.0)
MCH: 28.6 pg (ref 26.0–34.0)
MCHC: 33.2 g/dL (ref 30.0–36.0)
MCV: 86.3 fL (ref 80.0–100.0)
Monocytes Absolute: 0.6 10*3/uL (ref 0.1–1.0)
Monocytes Relative: 7 %
Neutro Abs: 5.1 10*3/uL (ref 1.7–7.7)
Neutrophils Relative %: 57 %
Platelets: 268 10*3/uL (ref 150–400)
RBC: 4.68 MIL/uL (ref 4.22–5.81)
RDW: 11.9 % (ref 11.5–15.5)
WBC: 8.9 10*3/uL (ref 4.0–10.5)
nRBC: 0 % (ref 0.0–0.2)

## 2022-05-12 LAB — COMPREHENSIVE METABOLIC PANEL
ALT: 21 U/L (ref 0–44)
AST: 24 U/L (ref 15–41)
Albumin: 3.9 g/dL (ref 3.5–5.0)
Alkaline Phosphatase: 86 U/L (ref 38–126)
Anion gap: 11 (ref 5–15)
BUN: 12 mg/dL (ref 6–20)
CO2: 21 mmol/L — ABNORMAL LOW (ref 22–32)
Calcium: 8.9 mg/dL (ref 8.9–10.3)
Chloride: 104 mmol/L (ref 98–111)
Creatinine, Ser: 0.47 mg/dL — ABNORMAL LOW (ref 0.61–1.24)
GFR, Estimated: >60 mL/min (ref >60)
Glucose, Bld: 96 mg/dL (ref 70–99)
Potassium: 3.5 mmol/L (ref 3.5–5.1)
Sodium: 136 mmol/L (ref 135–145)
Total Bilirubin: 0.1 mg/dL — ABNORMAL LOW (ref 0.3–1.2)
Total Protein: 6.8 g/dL (ref 6.5–8.1)

## 2022-05-12 LAB — ETHANOL: Alcohol, Ethyl (B): <10 mg/dL (ref <10)

## 2022-05-12 LAB — SALICYLATE LEVEL: Salicylate Lvl: <7 mg/dL — ABNORMAL LOW (ref 7.0–30.0)

## 2022-05-12 LAB — ACETAMINOPHEN LEVEL: Acetaminophen (Tylenol), Serum: <10 ug/mL — ABNORMAL LOW (ref 10–30)

## 2022-05-12 LAB — RAPID URINE DRUG SCREEN, HOSP PERFORMED
Amphetamines: NOT DETECTED
Barbiturates: NOT DETECTED
Benzodiazepines: NOT DETECTED
Cocaine: NOT DETECTED
Opiates: NOT DETECTED
Tetrahydrocannabinol: NOT DETECTED

## 2022-05-12 MED ORDER — ARIPIPRAZOLE 15 MG PO TABS: 15.0000 mg | ORAL_TABLET | Freq: Every day | ORAL | 0 refills | Status: DC
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
## 2022-05-12 NOTE — Progress Notes (Signed)
Pt has been psych cleared per Vesta Mixer, NP. This CSW will now remove pt from the [facility] shift report. TOC to assist with any discharge needs.   Benjaman Kindler, MSW, [facility] 05/12/2022 8:29 PM

## 2022-05-12 NOTE — Discharge Instructions (Signed)
Please follow up for extra [address], [place], [place] [address] [phone]

## 2022-05-12 NOTE — ED Notes (Signed)
Pt made phone call. 

## 2022-05-12 NOTE — ED Notes (Signed)
Patient called family to advised of impending discharge; patient provided with bus pass at [facility]
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
## 2022-05-12 NOTE — ED Triage Notes (Signed)
While pt was having a argument with  her dad, pt started having suicidal thoughts.Her plan is to run out in traffic

## 2022-05-14 ENCOUNTER — Telehealth (HOSPITAL_COMMUNITY): Payer: Self-pay | Admitting: Licensed Clinical Social Worker

## 2022-05-21 ENCOUNTER — Encounter (HOSPITAL_COMMUNITY): Payer: Self-pay

## 2022-05-21 ENCOUNTER — Telehealth (HOSPITAL_COMMUNITY): Payer: Self-pay | Admitting: Professional

## 2022-05-21 ENCOUNTER — Ambulatory Visit (HOSPITAL_COMMUNITY): Payer: Self-pay

## 2022-05-26 ENCOUNTER — Ambulatory Visit (HOSPITAL_COMMUNITY): Payer: Medicaid Other | Admitting: Professional

## 2022-05-26 DIAGNOSIS — F411 Generalized anxiety disorder: Secondary | ICD-10-CM

## 2022-05-26 NOTE — Psych (Signed)
Virtual Visit via Video Note  I connected with [name] on 05/26/22 at  1:00 PM EDT by a video enabled telemedicine application and verified that I am speaking with the correct person using two identifiers.  Location: Patient: home Provider: Clinical Home Office   I discussed the limitations of evaluation and management by telemedicine and the availability of in person appointments. The patient expressed understanding and agreed to proceed.   Follow Up Instructions:    I discussed the assessment and treatment plan with the patient. The patient was provided an opportunity to ask questions and all were answered. The patient agreed with the plan and demonstrated an understanding of the instructions.   The patient was advised to call back or seek an in-person evaluation if the symptoms worsen or if the condition fails to improve as anticipated.  I provided 15 minutes of non-face-to-face time during this encounter.   Quinn Axe, LCMHC  Cln starts CCA with pt. When asked about treatment, [name] reports she is seeing her individual therapist 1x weekly and DBT group 1x weekly, both at [facility]. [name] reports she wants to complete her DBT group before starting PHP. Cln discusses putting CCA for PHP on hold until DBT group is completed due to not being able to participate in both at one time. [name] is unsure when DBT group will finish. [name] will call [phone] to schedule PHP CCA at a later date if she feels it is needed after DBT group is complete. [name] denies SI/HI/AVH.

## 2022-06-08 ENCOUNTER — Telehealth (INDEPENDENT_AMBULATORY_CARE_PROVIDER_SITE_OTHER): Payer: Medicaid Other | Admitting: Student in an Organized Health Care Education/Training Program

## 2022-06-08 ENCOUNTER — Encounter (HOSPITAL_COMMUNITY): Payer: Self-pay | Admitting: Student in an Organized Health Care Education/Training Program

## 2022-06-08 DIAGNOSIS — F649 Gender identity disorder, unspecified: Secondary | ICD-10-CM

## 2022-06-08 DIAGNOSIS — F330 Major depressive disorder, recurrent, mild: Secondary | ICD-10-CM

## 2022-06-08 DIAGNOSIS — F902 Attention-deficit hyperactivity disorder, combined type: Secondary | ICD-10-CM

## 2022-06-08 DIAGNOSIS — F603 Borderline personality disorder: Secondary | ICD-10-CM | POA: Diagnosis not present

## 2022-06-08 MED ORDER — TRAZODONE HCL 100 MG PO TABS: 100.0000 mg | ORAL_TABLET | Freq: Every day | ORAL | 2 refills | Status: DC

## 2022-06-08 MED ORDER — DESVENLAFAXINE SUCCINATE ER 100 MG PO TB24: 100.0000 mg | ORAL_TABLET | Freq: Every day | ORAL | 3 refills | Status: DC

## 2022-06-08 MED ORDER — GUANFACINE HCL ER 2 MG PO TB24: 2.0000 mg | ORAL_TABLET | Freq: Every day | ORAL | 3 refills | Status: DC

## 2022-06-08 MED ORDER — ARIPIPRAZOLE 15 MG PO TABS: 15.0000 mg | ORAL_TABLET | Freq: Every day | ORAL | 2 refills | Status: DC

## 2022-06-08 MED ORDER — DEXMETHYLPHENIDATE HCL 10 MG PO TABS: 20.0000 mg | ORAL_TABLET | Freq: Every day | ORAL | 0 refills | Status: DC

## 2022-06-08 NOTE — Progress Notes (Signed)
Virtual Visit via Video Note  I connected with Scott [name] on 06/08/22 at  3:00 PM EDT by a video enabled telemedicine application and verified that I am speaking with the correct person using two identifiers.  Location: Patient: [facility] in [place] Provider: Office   I discussed the limitations of evaluation and management by telemedicine and the availability of in person appointments. The patient expressed understanding and agreed to proceed.     I discussed the assessment and treatment plan with the patient. The patient was provided an opportunity to ask questions and all were answered. The patient agreed with the plan and demonstrated an understanding of the instructions.   The patient was advised to call back or seek an in-person evaluation if the symptoms worsen or if the condition fails to improve as anticipated.  I provided 30 minutes of non-face-to-face time during this encounter.   Scott Morton, MD  [facility] MD/PA/NP OP Progress Note  06/08/2022 4:13 PM Scott [name]  MRN:  [id number]  Chief Complaint:  Chief Complaint  Patient presents with   Follow-up   HPI: Scott [name] "Scott [name]" is a transgender male with a past psychiatric history significant for PTSD, major depressive disorder, generalized anxiety disorder, and attention deficit hyperactivity disorder (combined type).  Patient was recently discharged from [facility] 11/29 after being admitted due to SI. Patient reports compliance with the following medications:    Abilify  QHS Trazodone  QHS Hydroxyzine  q6h PRN Pristiq  daily Intuniv  t24 daily  Focalin  daily   Patient provider concerned about elevated TSH being related to hormones. Currently monitoring.   Patient has been attending DBT therapy. He reports that his Abilify was increased to  in the ED when he was suicidal and he feels like the increase helps him stay stable. Patient reports that he does not require the  Hydroxyzine daily, and only requires every once in a while.   Patient reports he has been out of his Intuniv for about 1 month but has been on the Focalin, and he has been able to focus some without Intuniv. They think that they still need the Intuniv for their best function.   Patient reports that when he was having SI, he was having a difficult time at home having arguments in the home. He reports that he he felt really upset and unstable and felt like the  of Abilify was not helping. Patient reports that at the time he was feeling very depressed but he no longer feels like this. He reports that as of now his mental health is in a good place. Patient reports that he is sleeping well and he has girlfriend. He reports that this is a different girlfriend from Scott [name] ( they have broken up) this new relationship has been official for 1 day but they have known each other for a few days. Patient reports that he met this person at friends house. Patient reports good appetite. Patient denies HI, SI, self-harm and AVH. Patient denies anhedonia and endorses feeling better than usual.   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  Patient denies paranoid.   Patient endorses 8-9hrs of sleep and no racing thoughts. Patient denies impulsive behaviors.    Visit Diagnosis:    ICD-10-CM   1. Borderline personality disorder  F60.3     2. MDD (major depressive disorder), recurrent episode, mild  F33.0 desvenlafaxine (PRISTIQ) 100 MG  24 hr tablet    traZODone (DESYREL) 100 MG tablet    3. ADHD (attention deficit hyperactivity disorder), combined type  F90.2 dexmethylphenidate (FOCALIN) 10 MG tablet    guanFACINE (INTUNIV) 2 MG TB24 ER tablet    4. 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  Continued medications at adjusted dosages Abilify 7.5, Pristiq 100 mg daily, trazodone 100 mg nightly and hydroxyzine 25 every 6 as needed as well as Intuniv 2 mg daily and Focalin 20 mg daily   11/2021: Patient continued on Pristiq 100 mg, Abilify 5 mg, trazodone 100 mg nightly and his PCP prescribed Intuniv 2 mg and Focalin 20 mg.  Patient appeared stable at this visit.  Patient did endorse that he was having more issues with his father and was planning to move out in the next 6 months to [name] with a friend.  02/2022-decrease Abilify now that patient is doing better and is already endorsing increase appetite. Will also suggest taking in the AM as this may be part of why patient is "too activated" to get a good night rest.  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    Past Surgical History:  Procedure Laterality Date   ADENOIDECTOMY     EYE MUSCLE SURGERY Bilateral    x3   MYRINGOTOMY WITH TUBE PLACEMENT Bilateral    TONSILLECTOMY AND ADENOIDECTOMY Bilateral 02/03/2013   Procedure: TONSILLECTOMY AND ADENOIDECTOMY;  Surgeon: Osborn Coho, MD;  Location: [facility];  Service: ENT;  Laterality: Bilateral;    Family Psychiatric History: Father - ADHD Mother - bipolar disorder and borderline personality disorder  Family History:  Family History  Problem Relation Age of Onset   Mental illness Mother    Bipolar disorder Mother    Personality disorder Mother    Alcohol abuse Mother    Drug abuse Mother    Skin cancer Maternal  Grandmother    Mental illness Paternal Grandmother    Hepatitis C Paternal Grandfather    Cirrhosis Paternal Grandfather    Heart attack Paternal Grandfather     Social History:  Social History   Socioeconomic History   Marital status: Single    Spouse name: Not on file   Number of children: Not on file   Years of education: Not on file   Highest education level: High school graduate  Occupational History   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    He has graduated from high school   Recently quit working at [employer] Resource Strain: Not on file  Food Insecurity: Food Insecurity Present (01/14/2022)   Hunger Vital Sign    Worried About Running Out of Food in the Last Year: Sometimes true    Ran Out of Food in the Last Year: Never true  Transportation Needs: Unmet Transportation Needs (01/14/2022)   PRAPARE - Administrator, Civil Service (Medical): Yes    Lack of Transportation (Non-Medical): Yes  Physical Activity: Not on file  Stress: Not on file  Social Connections: Not on file    Allergies: No Known Allergies  Metabolic Disorder Labs: Lab Results  Component Value Date   HGBA1C 5.3 01/13/2022   MPG 105 01/13/2022   MPG 91.06 09/12/2021   Lab Results  Component Value Date   PROLACTIN 6.4 07/09/2020   PROLACTIN 22.4 (H) 04/02/2020   Lab  Results  Component Value Date   CHOL 202 (H) 01/13/2022   TRIG 147 01/13/2022   HDL 30 (L) 01/13/2022   CHOLHDL 6.7 01/13/2022   VLDL 29 01/13/2022   LDLCALC 143 (H) 01/13/2022   LDLCALC 132 (H) 09/12/2021   Lab Results  Component Value Date   TSH 5.194 (H) 01/23/2022   TSH 4.557 (H) 01/13/2022    Therapeutic Level Labs: No results found for: "LITHIUM" No results found for: "VALPROATE" No results found for: "CBMZ"  Current Medications: Current Outpatient Medications  Medication Sig Dispense Refill   ARIPiprazole (ABILIFY) 15 MG tablet Take 1 tablet (15 mg total) by mouth daily. 30 tablet 2   desvenlafaxine (PRISTIQ) 100 MG 24 hr tablet Take 1 tablet (100 mg total) by mouth daily. 30 tablet 3   dexmethylphenidate (FOCALIN) 10 MG tablet Take 2 tablets (20 mg total) by mouth daily. 60 tablet 0   Estradiol Valerate 10 MG/ML OIL INJECT 0.3 ML (3 MG) ONCE WEEKLY INTO THE MUSCLE AS DIRECTED 5 mL 1   fluticasone (FLONASE) 50 MCG/ACT nasal spray Place 1 spray into both nostrils daily as needed for allergies.     guanFACINE (INTUNIV) 2 MG TB24 ER tablet Take 1 tablet (2 mg total) by mouth daily. 30 tablet 3   hydrOXYzine (ATARAX) 25 MG tablet Take 1 tablet (25 mg total) by mouth every 6 (six) hours as needed for anxiety. 75 tablet 0   pantoprazole (PROTONIX) 40 MG tablet Take 1 tablet (40 mg total) by mouth daily. For acid reflux 30 tablet 0   progesterone (PROMETRIUM) 100 MG capsule Take 1 capsule (100 mg total) by mouth daily. 30 capsule 3   traZODone (DESYREL) 100 MG tablet Take 1 tablet (100 mg total) by mouth at bedtime. For sleep 30 tablet 2   triamcinolone ointment (KENALOG) 0.5 % Apply 1 application. topically 2 (two) times daily. 60 g 3   No current facility-administered medications for this visit.    Psychiatric Specialty Exam: Review of Systems  Psychiatric/Behavioral:  Negative for dysphoric mood, hallucinations and suicidal ideas. The patient is not nervous/anxious.      There were no vitals taken for this visit.There is no height or weight on file to calculate BMI.  General Appearance: Casual  Eye Contact:  Good  Speech:  Clear and Coherent  Volume:  Normal  Mood:  Euthymic  Affect:  Appropriate  Thought Process:  Coherent  Orientation:  Full (Time, Place, and Person)  Thought Content: Logical   Suicidal Thoughts:  No  Homicidal Thoughts:  No  Memory:  Immediate;   Good Recent;   Good  Judgement:  Good  Insight:  Good  Psychomotor Activity:  Normal  Concentration:  Concentration: Good  Recall:  Fair  Fund of Knowledge: Good  Language: Good  Akathisia:  NA  Handed:    AIMS (if indicated): na  Assets:  Communication Skills Desire for Improvement Housing Leisure Time Resilience Social Support  ADL's:  Intact  Cognition: WNL  Sleep:  Good   Screenings: AIMS    Flowsheet Row [facility]  AIMS Total Score 0      AUDIT    Flowsheet Row [facility]) [facility] Admission (Dis[facility]  Alcohol Use Disorder Identification Test Final Score (AUDIT) 0 0      GAD-7    Flowsheet Row Office Visit from 07/22/2021 in [place] [facility] Office Visit from 07/17/2021 in [facility] Office Visit from 05/27/2021 in [place] [facility] Counselor from 05/06/2021 in [facility] Office Visit from 04/29/2021 in [place] and [facility]  Total GAD-7 Score 11 13 2 17 5       PHQ2-9    Flowsheet Row ED from 01/13/2022 in [facility] Video Visit from 08/29/2021 in [facility] Office Visit from 07/22/2021 in [place] and [facility] Office Visit from 07/17/2021 in [facility] Counselor from 05/28/2021 in [facility]  PHQ-2 Total Score 6 3 3 3 1   PHQ-9 Total Score 24 17 13 17 14       Flowsheet Row ED from 05/12/2022 in [facility] Emergency Department at [facility] Most recent reading at 05/12/2022  3:55 PM ED from 01/23/2022 in [facility] Most recent reading at 01/23/2022 11:57 PM OP [facility] Most recent reading at 01/23/2022  9:15 PM  C-SSRS RISK CATEGORY High Risk High Risk High Risk        Assessment and Plan: Patient endorses doing fairly well on current medication regimen however they would like to have their Intuniv added back.  Patient does like they benefited from having their Abilify significantly increased.  Did have discussion with patient that he needs an updated EKG when he sees his PCP this week, patient endorsed understanding.  Patient judgment appears to be very good as he recognizes that he needs to continue DBT before starting PHP and that it was beneficial as well as requesting an agreement with provider that his visits be around the every 6 to 8 weeks as he has noticed that he tends to have an ED visit for mental health around the 60-month mark.  Qtc 460 with NSR on 05/11/2022  Borderline personality disorder Gender Dysphoria MDD, recurrent, mild  PTSD (stable) - Continue Abilify 15mg  daily - Heart taking Pristiq 100 mg daily - Continue Trazodone 100mg  QHS  GAD - Continue hydroxyzine 25 mg every 6  hours as needed - Pristiq 100 mg daily  ADHD - Continue Intuniv 2 mg T24 ER daily - Continue Focalin 20 mg daily, if able   - F/u in approx 6 weeks as he appears to have an episode approx every 3 months - requested that he get an EKG at PCP this week  Collaboration of Care: Collaboration of Care:    Patient/Guardian was advised Release of Information must be obtained prior to any record release in order to collaborate their care with an outside provider. Patient/Guardian was advised if they have not already done so to contact the registration department to sign all necessary forms in order for [place] to release information regarding their care.   Consent: Patient/Guardian gives verbal consent for treatment and assignment of benefits for services provided during this visit. Patient/Guardian expressed understanding and agreed to proceed.   PGY-3 Scott Morton, MD 06/08/2022, 4:13 PM

## 2022-06-11 ENCOUNTER — Emergency Department (HOSPITAL_COMMUNITY): Payer: Medicaid Other

## 2022-06-11 ENCOUNTER — Other Ambulatory Visit: Payer: Self-pay

## 2022-06-11 ENCOUNTER — Emergency Department (HOSPITAL_COMMUNITY)
Admission: EM | Admit: 2022-06-11 | Discharge: 2022-06-11 | Disposition: A | Discharge status: Home / Self Care | Payer: Medicaid Other | Attending: Emergency Medicine | Admitting: Emergency Medicine

## 2022-06-11 DIAGNOSIS — R000 Tachycardia, unspecified: Secondary | ICD-10-CM | POA: Diagnosis present

## 2022-06-11 DIAGNOSIS — E876 Hypokalemia: Secondary | ICD-10-CM | POA: Insufficient documentation

## 2022-06-11 DIAGNOSIS — R002 Palpitations: Secondary | ICD-10-CM | POA: Diagnosis not present

## 2022-06-11 DIAGNOSIS — R0602 Shortness of breath: Secondary | ICD-10-CM | POA: Diagnosis not present

## 2022-06-11 LAB — CBC
HCT: 37.7 % — ABNORMAL LOW (ref 39.0–52.0)
Hemoglobin: 12.8 g/dL — ABNORMAL LOW (ref 13.0–17.0)
MCH: 29.5 pg (ref 26.0–34.0)
MCHC: 34 g/dL (ref 30.0–36.0)
MCV: 86.9 fL (ref 80.0–100.0)
Platelets: 209 10*3/uL (ref 150–400)
RBC: 4.34 MIL/uL (ref 4.22–5.81)
RDW: 12 % (ref 11.5–15.5)
WBC: 6.5 10*3/uL (ref 4.0–10.5)
nRBC: 0 % (ref 0.0–0.2)

## 2022-06-11 LAB — BASIC METABOLIC PANEL
Anion gap: 8 (ref 5–15)
BUN: 11 mg/dL (ref 6–20)
CO2: 26 mmol/L (ref 22–32)
Calcium: 8.5 mg/dL — ABNORMAL LOW (ref 8.9–10.3)
Chloride: 103 mmol/L (ref 98–111)
Creatinine, Ser: 0.61 mg/dL (ref 0.61–1.24)
GFR, Estimated: >60 mL/min (ref >60)
Glucose, Bld: 92 mg/dL (ref 70–99)
Potassium: 3.4 mmol/L — ABNORMAL LOW (ref 3.5–5.1)
Sodium: 137 mmol/L (ref 135–145)

## 2022-06-11 LAB — TROPONIN I (HIGH SENSITIVITY): Troponin I (High Sensitivity): 3 ng/L (ref <18)

## 2022-06-11 MED ORDER — POTASSIUM CHLORIDE CRYS ER 20 MEQ PO TBCR: 20.0000 meq | EXTENDED_RELEASE_TABLET | Freq: Every day | ORAL | 0 refills | Status: DC

## 2022-06-11 MED ORDER — POTASSIUM CHLORIDE CRYS ER 20 MEQ PO TBCR
40.0000 meq | EXTENDED_RELEASE_TABLET | Freq: Once | ORAL | Status: AC
  Administered 2022-06-11: 40 meq via ORAL
  Filled 2022-06-11: qty 2

## 2022-06-11 NOTE — Discharge Instructions (Signed)
1.  Your heart rate was mildly elevated in the emergency department.  It is gone back to normal.  At this time, continue to stay hydrated.  Your potassium was very mildly low.  You were given 1 dose of potassium in the emergency department.  You have been given a potassium tablet to take for the next week.  Your Doctor should recheck your levels. 2.  Follow-up with your doctor tomorrow as planned. 3.  Return to the emergency department if you are getting sudden shortness of breath, racing heart feeling like you will pass out or other concerning changes.

## 2022-06-11 NOTE — ED Notes (Signed)
Pt ambulated w/o assist up and down the hallway. O2 sat 99-100%. HR 95-105. C/o "feeling lightheaded"

## 2022-06-11 NOTE — ED Provider Notes (Signed)
[place] EMERGENCY DEPARTMENT AT [facility] Provider Note   CSN: [id number] Arrival date & time: 06/11/22  1335     History  Chief Complaint  Patient presents with   Tachycardia    Scott [name] is a 22 y.o. adult.  HPI Patient reports history of intermittent tachycardia but no specific diagnosis.  Started on propranolol for elevated heart rate during psychiatric hospitalization..  At that time thought it was due to anxiety.  Patient reports taking the propranolol fairly regularly.  Last dose yesterday but none today.  Patient reports spending several days with friends and [place] day before yesterday and smoking marijuana during that period of time.  Today patient went to do some exercising with their sister.  When returning, heart rate elevated up to 160s.  EMS was called.  Upon EMS arrival, heart rate was already slowing back down again.  No associated chest pain.  Mild associated shortness of breath.  No syncope but felt near syncopal.  No lower extremity swelling or calf pain.  Patient reports it had been suggested in the past that he may have POTS syndrome but also diagnostic evaluation has not been done.    Home Medications Prior to Admission medications   Medication Sig Start Date End Date Taking? Authorizing Provider  ARIPiprazole (ABILIFY) 15 MG tablet Take 1 tablet (15 mg total) by mouth daily. 06/08/22  Yes Bobbye Morton, MD  desvenlafaxine (PRISTIQ) 100 MG 24 hr tablet Take 1 tablet (100 mg total) by mouth daily. 06/08/22  Yes Bobbye Morton, MD  dexmethylphenidate (FOCALIN) 10 MG tablet Take 2 tablets (20 mg total) by mouth daily. 06/08/22 06/08/23 Yes Bobbye Morton, MD  Estradiol Valerate 10 MG/ML OIL INJECT 0.3 ML (3 MG) ONCE WEEKLY INTO THE MUSCLE AS DIRECTED Patient taking differently: Inject 3 mg into the skin every Sunday. INJECT 0.3 ML (3 MG) ONCE WEEKLY INTO THE MUSCLE AS DIRECTED 09/15/21  Yes Verneda Skill, [name]  fluticasone (FLONASE) 50  MCG/ACT nasal spray Place 1 spray into both nostrils daily as needed for allergies. 07/01/18  Yes [provider]  guanFACINE (INTUNIV) 2 MG TB24 ER tablet Take 1 tablet (2 mg total) by mouth daily. 06/08/22  Yes Bobbye Morton, MD  hydrOXYzine (ATARAX) 25 MG tablet Take 1 tablet (25 mg total) by mouth every 6 (six) hours as needed for anxiety. Patient taking differently: Take 25 mg by mouth at bedtime as needed (for sleep). 01/18/22  Yes Armandina Stammer I, NP  levocetirizine (XYZAL) 5 MG tablet Take 5 mg by mouth every evening.   Yes [provider]  ondansetron (ZOFRAN) 4 MG tablet Take 4 mg by mouth daily as needed for nausea or vomiting.   Yes [provider]  pantoprazole (PROTONIX) 40 MG tablet Take 1 tablet (40 mg total) by mouth daily. For acid reflux Patient taking differently: Take 40 mg by mouth daily before breakfast. For acid reflux 01/19/22  Yes Nwoko, Nicole Kindred I, NP  potassium chloride SA (KLOR-CON M) 20 MEQ tablet Take 1 tablet (20 mEq total) by mouth daily. 06/11/22  Yes Arby Barrette, MD  progesterone (PROMETRIUM) 100 MG capsule Take 1 capsule (100 mg total) by mouth daily. 07/22/21  Yes Verneda Skill, [name]  propranolol (INDERAL) 10 MG tablet Take 10 mg by mouth daily.   Yes [provider]  traZODone (DESYREL) 100 MG tablet Take 1 tablet (100 mg total) by mouth at bedtime. For sleep Patient taking differently: Take 100 mg  by mouth at bedtime as needed for sleep. 06/08/22  Yes Eliseo Gum B, MD  triamcinolone ointment (KENALOG) 0.5 % Apply 1 application. topically 2 (two) times daily. Patient taking differently: Apply 1 application  topically 2 (two) times daily as needed (for dermatitis). 04/29/21  Yes Verneda Skill, [name]      Allergies    Patient has no known allergies.    Review of Systems   Review of Systems  Physical Exam Updated Vital Signs BP 124/78 (BP Location: Right Arm)   Pulse 84   Temp 98.9 F (37.2 C)   Resp 14   Ht 5\' 7"   (1.702 m)   Wt 81.6 kg   SpO2 100%   BMI 28.19 kg/m  Physical Exam Constitutional:      Comments: Alert nontoxic in appearance.  HENT:     Head: Normocephalic and atraumatic.     Mouth/Throat:     Pharynx: Oropharynx is clear.  Eyes:     Extraocular Movements: Extraocular movements intact.  Cardiovascular:     Comments: Persistent borderline tachycardia.  Heart rate about 90s to 110.  No appreciable rub or gallop.  Regular. Pulmonary:     Effort: Pulmonary effort is normal.     Breath sounds: Normal breath sounds.  Abdominal:     General: There is no distension.     Palpations: Abdomen is soft.     Tenderness: There is no abdominal tenderness. There is no guarding.  Musculoskeletal:        General: No swelling or tenderness. Normal range of motion.     Cervical back: Normal range of motion.     Right lower leg: No edema.     Left lower leg: No edema.  Skin:    General: Skin is warm and dry.  Neurological:     General: No focal deficit present.     Mental Status: She is oriented to person, place, and time.  Psychiatric:        Mood and Affect: Mood normal.        Behavior: Behavior normal.     ED Results / Procedures / Treatments   Labs (all labs ordered are listed, but only abnormal results are displayed) Labs Reviewed  BASIC METABOLIC PANEL - Abnormal; Notable for the following components:      Result Value   Potassium 3.4 (*)    Calcium 8.5 (*)    All other components within normal limits  CBC - Abnormal; Notable for the following components:   Hemoglobin 12.8 (*)    HCT 37.7 (*)    All other components within normal limits  TROPONIN I (HIGH SENSITIVITY)  TROPONIN I (HIGH SENSITIVITY)    EKG EKG will not imported into MUSE.  Sinus tachycardia 109 PR 168 QRS 91 nonspecific T wave flattening.  No significant change from previous tracing except for mild tachycardia.  Radiology DG Chest 2 View  Result Date: 06/11/2022 CLINICAL DATA:  Tachycardia. EXAM:  CHEST - 2 VIEW COMPARISON:  None Available. FINDINGS: Both lungs are clear. Negative for a pneumothorax. Heart and mediastinum are within normal limits. Trachea is midline. Bony thorax is intact. No pleural effusions. IMPRESSION: No active cardiopulmonary disease. Electronically Signed   By: Richarda Overlie M.D.   On: 06/11/2022 14:15    Procedures Procedures    Medications Ordered in ED Medications  potassium chloride SA (KLOR-CON M) CR tablet 40 mEq (40 mEq Oral Given 06/11/22 1711)    ED Course/ Medical Decision Making/ A&P  Medical Decision Making Amount and/or Complexity of Data Reviewed Labs: ordered. Radiology: ordered.  Risk Prescription drug management.   Patient presents as outlined. Experiencing periodic episodes of increased heart rate and lightheadedness.  At this time patient does not appear to have significant cardiovascular risk factors at young age.  EKG is unchanged from prior tracings.  Troponin is negative.  Low suspicion for ACS.  Patient may be experiencing episodes of sinus tachycardia or possibly SVT.  No SVT captured in the emergency department.  Patient did report marijuana use over the past several days and some increased anxiety.  Possibly precipitating sinus tachycardia.  Low suspicion for PE.  Patient does not have any peripheral edema or calf tenderness.  Symptoms are waxing and waning.  No hypoxia.  With no intervention patient did return to normal rate and normal blood pressure with 100% oxygenation and no respiratory symptoms.  Potassium was just at the low end of normal.  Will have light potassium replacement for the next week.  Also recommended hydration.  Patient has follow-up tomorrow with PCP.  We have reviewed return precautions patient voices understanding is discharged in good condition asymptomatic with normal vital signs.        Final Clinical Impression(s) / ED Diagnoses Final diagnoses:  Palpitations  Sinus  tachycardia  Hypokalemia    Rx / DC Orders ED Discharge Orders          Ordered    potassium chloride SA (KLOR-CON M) 20 MEQ tablet  Daily        06/11/22 1909              Arby Barrette, MD 06/11/22 1916

## 2022-06-11 NOTE — ED Triage Notes (Signed)
BIBA from home from tachycardia- has hx of this, did not take propranolol today, has been noncompliant with this medication 105-108 HR 124/80 BP 22 rr 96 cbg

## 2022-06-14 ENCOUNTER — Encounter (HOSPITAL_COMMUNITY): Payer: Self-pay

## 2022-06-14 ENCOUNTER — Emergency Department (HOSPITAL_COMMUNITY)
Admission: EM | Admit: 2022-06-14 | Discharge: 2022-06-14 | Disposition: A | Discharge status: Home / Self Care | Payer: Medicaid Other | Attending: Emergency Medicine | Admitting: Emergency Medicine

## 2022-06-14 DIAGNOSIS — R197 Diarrhea, unspecified: Secondary | ICD-10-CM | POA: Diagnosis present

## 2022-06-14 LAB — URINALYSIS, ROUTINE W REFLEX MICROSCOPIC
Bilirubin Urine: NEGATIVE
Glucose, UA: NEGATIVE mg/dL
Hgb urine dipstick: NEGATIVE
Ketones, ur: NEGATIVE mg/dL
Leukocytes,Ua: NEGATIVE
Nitrite: NEGATIVE
Protein, ur: NEGATIVE mg/dL
Specific Gravity, Urine: 1.014 (ref 1.005–1.030)
pH: 5 (ref 5.0–8.0)

## 2022-06-14 LAB — COMPREHENSIVE METABOLIC PANEL
ALT: 23 U/L (ref 0–44)
AST: 21 U/L (ref 15–41)
Albumin: 3.8 g/dL (ref 3.5–5.0)
Alkaline Phosphatase: 75 U/L (ref 38–126)
Anion gap: 12 (ref 5–15)
BUN: 12 mg/dL (ref 6–20)
CO2: 16 mmol/L — ABNORMAL LOW (ref 22–32)
Calcium: 8.3 mg/dL — ABNORMAL LOW (ref 8.9–10.3)
Chloride: 106 mmol/L (ref 98–111)
Creatinine, Ser: 0.59 mg/dL — ABNORMAL LOW (ref 0.61–1.24)
GFR, Estimated: >60 mL/min (ref >60)
Glucose, Bld: 103 mg/dL — ABNORMAL HIGH (ref 70–99)
Potassium: 3.7 mmol/L (ref 3.5–5.1)
Sodium: 134 mmol/L — ABNORMAL LOW (ref 135–145)
Total Bilirubin: 0.4 mg/dL (ref 0.3–1.2)
Total Protein: 7 g/dL (ref 6.5–8.1)

## 2022-06-14 LAB — CBC WITH DIFFERENTIAL/PLATELET
Abs Immature Granulocytes: 0.04 10*3/uL (ref 0.00–0.07)
Basophils Absolute: 0 10*3/uL (ref 0.0–0.1)
Basophils Relative: 0 %
Eosinophils Absolute: 0.1 10*3/uL (ref 0.0–0.5)
Eosinophils Relative: 2 %
HCT: 40.4 % (ref 39.0–52.0)
Hemoglobin: 13.5 g/dL (ref 13.0–17.0)
Immature Granulocytes: 0 %
Lymphocytes Relative: 22 %
Lymphs Abs: 2 10*3/uL (ref 0.7–4.0)
MCH: 28.7 pg (ref 26.0–34.0)
MCHC: 33.4 g/dL (ref 30.0–36.0)
MCV: 85.8 fL (ref 80.0–100.0)
Monocytes Absolute: 0.7 10*3/uL (ref 0.1–1.0)
Monocytes Relative: 7 %
Neutro Abs: 6.1 10*3/uL (ref 1.7–7.7)
Neutrophils Relative %: 69 %
Platelets: 240 10*3/uL (ref 150–400)
RBC: 4.71 MIL/uL (ref 4.22–5.81)
RDW: 11.8 % (ref 11.5–15.5)
WBC: 8.9 10*3/uL (ref 4.0–10.5)
nRBC: 0 % (ref 0.0–0.2)

## 2022-06-14 LAB — LIPASE, BLOOD: Lipase: 28 U/L (ref 11–51)

## 2022-06-14 MED ORDER — LOPERAMIDE HCL 2 MG PO CAPS: 2.0000 mg | ORAL_CAPSULE | Freq: Four times a day (QID) | ORAL | 0 refills | Status: DC | PRN

## 2022-06-14 MED ORDER — LOPERAMIDE HCL 2 MG PO CAPS
4.0000 mg | ORAL_CAPSULE | Freq: Once | ORAL | Status: AC
  Administered 2022-06-14: 4 mg via ORAL
  Filled 2022-06-14: qty 2

## 2022-06-14 MED ORDER — SODIUM CHLORIDE 0.9 % IV BOLUS
1000.0000 mL | Freq: Once | INTRAVENOUS | Status: AC
  Administered 2022-06-14: 1000 mL via INTRAVENOUS

## 2022-06-14 NOTE — ED Provider Notes (Signed)
[facility] AT [facility] Provider Note   CSN: [id number] Arrival date & time: 06/14/22  4098     History  Chief Complaint  Patient presents with   Diarrhea    Scott [name] is a 22 y.o. adult.  Patient who is a transgender male presents to the emergency department today for evaluation of diarrhea.  Symptoms have been ongoing for about 48 hours.  She reports watery, nonbloody stool approximately once every hour.  There are associated abdominal cramping with bowel movements.  No chest pain or shortness of breath.  No vomiting.  Subjective "hot flashes" reported but no documented fevers.  No recent suspicious food or water exposures, significant travel or camping.  No recent antibiotic use.  Patient was in the emergency department a couple of days ago for a "heart problem" and was given potassium supplementation.  No abdominal surgery history.       Home Medications Prior to Admission medications   Medication Sig Start Date End Date Taking? Authorizing Provider  ARIPiprazole (ABILIFY) 15 MG tablet Take 1 tablet (15 mg total) by mouth daily. 06/08/22   Bobbye Morton, MD  desvenlafaxine (PRISTIQ) 100 MG 24 hr tablet Take 1 tablet (100 mg total) by mouth daily. 06/08/22   Bobbye Morton, MD  dexmethylphenidate (FOCALIN) 10 MG tablet Take 2 tablets (20 mg total) by mouth daily. 06/08/22 06/08/23  Bobbye Morton, MD  Estradiol Valerate 10 MG/ML OIL INJECT 0.3 ML (3 MG) ONCE WEEKLY INTO THE MUSCLE AS DIRECTED Patient taking differently: Inject 3 mg into the skin every Sunday. INJECT 0.3 ML (3 MG) ONCE WEEKLY INTO THE MUSCLE AS DIRECTED 09/15/21   Verneda Skill, [name]  fluticasone (FLONASE) 50 MCG/ACT nasal spray Place 1 spray into both nostrils daily as needed for allergies. 07/01/18   [provider]  guanFACINE (INTUNIV) 2 MG TB24 ER tablet Take 1 tablet (2 mg total) by mouth daily. 06/08/22   Bobbye Morton, MD  hydrOXYzine (ATARAX) 25 MG  tablet Take 1 tablet (25 mg total) by mouth every 6 (six) hours as needed for anxiety. Patient taking differently: Take 25 mg by mouth at bedtime as needed (for sleep). 01/18/22   Armandina Stammer I, NP  levocetirizine (XYZAL) 5 MG tablet Take 5 mg by mouth every evening.    [provider]  ondansetron (ZOFRAN) 4 MG tablet Take 4 mg by mouth daily as needed for nausea or vomiting.    [provider]  pantoprazole (PROTONIX) 40 MG tablet Take 1 tablet (40 mg total) by mouth daily. For acid reflux Patient taking differently: Take 40 mg by mouth daily before breakfast. For acid reflux 01/19/22   Armandina Stammer I, NP  potassium chloride SA (KLOR-CON M) 20 MEQ tablet Take 1 tablet (20 mEq total) by mouth daily. 06/11/22   Arby Barrette, MD  progesterone (PROMETRIUM) 100 MG capsule Take 1 capsule (100 mg total) by mouth daily. 07/22/21   Verneda Skill, [name]  propranolol (INDERAL) 10 MG tablet Take 10 mg by mouth daily.    [provider]  traZODone (DESYREL) 100 MG tablet Take 1 tablet (100 mg total) by mouth at bedtime. For sleep Patient taking differently: Take 100 mg by mouth at bedtime as needed for sleep. 06/08/22   Bobbye Morton, MD  triamcinolone ointment (KENALOG) 0.5 % Apply 1 application. topically 2 (two) times daily. Patient taking differently: Apply 1 application  topically 2 (two) times daily as needed (for  dermatitis). 04/29/21   Verneda Skill, [name]      Allergies    Patient has no known allergies.    Review of Systems   Review of Systems  Physical Exam Updated Vital Signs BP 128/84 (BP Location: Right Arm)   Pulse 98   Temp 97.9 F (36.6 C) (Oral)   Resp 18   SpO2 95%   Physical Exam Vitals and nursing note reviewed.  Constitutional:      General: She is not in acute distress.    Appearance: She is well-developed.  HENT:     Head: Normocephalic and atraumatic.     Right Ear: External ear normal.     Left Ear: External ear normal.     Nose:  Nose normal.     Mouth/Throat:     Mouth: Mucous membranes are moist.  Eyes:     Conjunctiva/sclera: Conjunctivae normal.  Cardiovascular:     Rate and Rhythm: Normal rate and regular rhythm.     Heart sounds: No murmur heard.    Comments: No tachycardia Pulmonary:     Effort: No respiratory distress.     Breath sounds: No wheezing, rhonchi or rales.  Abdominal:     Palpations: Abdomen is soft.     Tenderness: There is no abdominal tenderness. There is no guarding or rebound.     Comments: Patient laughs when I palpated over abdomen, no focal tenderness.   Musculoskeletal:     Cervical back: Normal range of motion and neck supple.     Right lower leg: No edema.     Left lower leg: No edema.  Skin:    General: Skin is warm and dry.     Findings: No rash.  Neurological:     General: No focal deficit present.     Mental Status: She is alert. Mental status is at baseline.     Motor: No weakness.  Psychiatric:        Mood and Affect: Mood normal.     ED Results / Procedures / Treatments   Labs (all labs ordered are listed, but only abnormal results are displayed) Labs Reviewed  COMPREHENSIVE METABOLIC PANEL - Abnormal; Notable for the following components:      Result Value   Sodium 134 (*)    CO2 16 (*)    Glucose, Bld 103 (*)    Creatinine, Ser 0.59 (*)    Calcium 8.3 (*)    All other components within normal limits  CBC WITH DIFFERENTIAL/PLATELET  LIPASE, BLOOD  URINALYSIS, ROUTINE W REFLEX MICROSCOPIC    EKG None  Radiology No results found.  Procedures Procedures    Medications Ordered in ED Medications  sodium chloride 0.9 % bolus 1,000 mL (0 mLs Intravenous Stopped 06/14/22 0845)  loperamide (IMODIUM) capsule 4 mg (4 mg Oral Given 06/14/22 4098)    ED Course/ Medical Decision Making/ A&P    Patient seen and examined. History obtained directly from patient.   Labs/EKG: Ordered CBC, CMP, lipase, UA.  Imaging: None ordered  Medications/Fluids:  Ordered: IV fluid bolus  Most recent vital signs reviewed and are as follows: BP 128/84 (BP Location: Right Arm)   Pulse 98   Temp 97.9 F (36.6 C) (Oral)   Resp 18   SpO2 95%   Initial impression: 2 days of diarrhea, well-appearing patient, will evaluate for electrolytes and dehydration.  No focal abdominal tenderness.  Anticipate symptom control.  9:05 AM Reassessment performed. Patient appears stable.  States that the Imodium  helped.  They are requesting to have her IV out.  She has been drinking some fluids at bedside and has not had any vomiting.  Labs personally reviewed and interpreted including: CBC unremarkable; CMP bicarb is 16 but normal anion gap, sodium 134, potassium normal today at 3.7, creatinine minimally low at 0.59 with a BUN of 12; lipase normal.  UA without concentration or compelling signs of infection.  Reviewed pertinent lab work and imaging with patient at bedside. Questions answered.   Most current vital signs reviewed and are as follows: BP 107/71   Pulse 75   Temp 97.9 F (36.6 C) (Oral)   Resp 17   SpO2 96%   Plan: Discharge to home.   Prescriptions written for: Imodium  Other home care instructions discussed: Parke Simmers diet  ED return instructions discussed: The patient was urged to return to the Emergency Department immediately with worsening of current symptoms, worsening abdominal pain, persistent vomiting, blood noted in stools, fever, or any other concerns. The patient verbalized understanding.   Follow-up instructions discussed: Patient encouraged to follow-up with their PCP in 72 hrs if not improving.                             Medical Decision Making Risk Prescription drug management.   Patient here today with abdominal cramps and diarrhea.  Diarrhea is nonbloody.  Low concern for significant colitis or enteritis.  No vomiting.  Labs are reassuring with near normal electrolytes and kidney function.  Patient appears well-hydrated.  Improved  with treatment here.  Abdominal exam is nontender.  No concerning recent travel.  Do not suspect C. difficile.  The patient's vital signs, pertinent lab work and imaging were reviewed and interpreted as discussed in the ED course. Hospitalization was considered for further testing, treatments, or serial exams/observation. However as patient is well-appearing, has a stable exam, and reassuring studies today, I do not feel that they warrant admission at this time. This plan was discussed with the patient who verbalizes agreement and comfort with this plan and seems reliable and able to return to the Emergency Department with worsening or changing symptoms.          Final Clinical Impression(s) / ED Diagnoses Final diagnoses:  Diarrhea, unspecified type    Rx / DC Orders ED Discharge Orders          Ordered    loperamide (IMODIUM) 2 MG capsule  4 times daily PRN        06/14/22 0858              Renne Crigler, PA-C 06/14/22 0908    Gloris Manchester, MD 06/14/22 [phone]

## 2022-06-14 NOTE — Discharge Instructions (Addendum)
Please read and follow all provided instructions.  Your diagnoses today include:  1. Diarrhea, unspecified type    Tests performed today include: Complete blood cell count: Blood cell counts are normal Basic metabolic panel: potassium normal, kidney function normal Vital signs. See below for your results today.   Medications prescribed:  Imodium: Medication for diarrhea Take any prescribed medications only as directed.  Home care instructions:  Follow any educational materials contained in this packet.  BE VERY CAREFUL not to take multiple medicines containing Tylenol (also called acetaminophen). Doing so can lead to an overdose which can damage your liver and cause liver failure and possibly death.   Follow-up instructions: Please follow-up with your primary care provider in the next 3 days for further evaluation of your symptoms if not improving.  Return instructions:  Please return to the Emergency Department if you experience worsening symptoms.  Please return if you have any other emergent concerns.  Additional Information:  Your vital signs today were: BP 107/71   Pulse 75   Temp 97.9 F (36.6 C) (Oral)   Resp 17   SpO2 96%  If your blood pressure (BP) was elevated above 135/85 this visit, please have this repeated by your doctor within one month. --------------

## 2022-06-14 NOTE — ED Triage Notes (Signed)
Pt arrived POV for "severe diarrhea" for 3 days, abd pain, hot flashes, and headache. Pt was seen 3 days ago for tachycardia and once discharge developed these new symptoms. Pt denies vomiting. Pt reports "if I force my self I can eat and drink". VSS, NAD noted.

## 2022-07-29 ENCOUNTER — Telehealth (INDEPENDENT_AMBULATORY_CARE_PROVIDER_SITE_OTHER): Payer: Medicaid Other | Admitting: Student in an Organized Health Care Education/Training Program

## 2022-07-29 ENCOUNTER — Encounter (HOSPITAL_COMMUNITY): Payer: Self-pay | Admitting: Student in an Organized Health Care Education/Training Program

## 2022-07-29 DIAGNOSIS — F3132 Bipolar disorder, current episode depressed, moderate: Secondary | ICD-10-CM

## 2022-07-29 DIAGNOSIS — F902 Attention-deficit hyperactivity disorder, combined type: Secondary | ICD-10-CM | POA: Diagnosis not present

## 2022-07-29 DIAGNOSIS — F330 Major depressive disorder, recurrent, mild: Secondary | ICD-10-CM

## 2022-07-29 DIAGNOSIS — F603 Borderline personality disorder: Secondary | ICD-10-CM

## 2022-07-29 DIAGNOSIS — F411 Generalized anxiety disorder: Secondary | ICD-10-CM

## 2022-07-29 MED ORDER — ABILIFY MAINTENA 400 MG IM PRSY
400.0000 mg | PREFILLED_SYRINGE | INTRAMUSCULAR | 11 refills | Status: DC
Start: 2022-07-29 — End: 2022-12-09

## 2022-07-29 MED ORDER — HYDROXYZINE HCL 25 MG PO TABS
25.0000 mg | ORAL_TABLET | Freq: Four times a day (QID) | ORAL | 1 refills | Status: DC | PRN
Start: 2022-07-29 — End: 2022-11-03

## 2022-07-29 MED ORDER — DESVENLAFAXINE SUCCINATE ER 100 MG PO TB24: 100.0000 mg | ORAL_TABLET | Freq: Every day | ORAL | 3 refills | Status: DC

## 2022-07-29 MED ORDER — GUANFACINE HCL ER 2 MG PO TB24: 2.0000 mg | ORAL_TABLET | Freq: Every day | ORAL | 3 refills | Status: DC

## 2022-07-29 NOTE — Addendum Note (Signed)
Addended by: Eliseo Gum B on: 07/29/2022 01:18 PM   Modules accepted: Level of Service

## 2022-07-29 NOTE — Progress Notes (Addendum)
Virtual Visit via Video Note  I connected with Scott [name] on 07/29/22 at 10:00 AM EDT by a video enabled telemedicine application and verified that I am speaking with the correct person using two identifiers.  Location: Patient: Home Provider: Office   I discussed the limitations of evaluation and management by telemedicine and the availability of in person appointments. The patient expressed understanding and agreed to proceed.    I discussed the assessment and treatment plan with the patient. The patient was provided an opportunity to ask questions and all were answered. The patient agreed with the plan and demonstrated an understanding of the instructions.   The patient was advised to call back or seek an in-person evaluation if the symptoms worsen or if the condition fails to improve as anticipated.  I provided 30 minutes of non-face-to-face time during this encounter.   Bobbye Morton, MD  [facility] MD/PA/NP OP Progress Note  07/29/2022 11:12 AM Scott [name]  MRN:  [id number]  Chief Complaint:  Chief Complaint  Patient presents with   Follow-up   HPI: Scott [name] "Scott [name]" is a transgender male with a past psychiatric history significant for PTSD, major depressive disorder, generalized anxiety disorder, and attention deficit hyperactivity disorder (combined type) and was recently diagnosed with Bipolar d/o at [facility] in 06/2022 where she was started on Abilify Maitenna 400mg  daily on 07/06/2022.   Current medication regimen:  Abilify  Maintenna IM 400mg  q28days Trazodone 100mg  QHS (not taking) Hydroxyzine 25mg  q6h PRN Pristiq 100mg  daily (has not taken in a week) Intuniv 2mg  t24 daily  Focalin 20mg  daily (ran out)  Propanolol LA 60mg  rx by PCP for HTN  Patient reports that they feel the shot has helped with mood stabilization.   Patient reports that they had a falling out with a friend. Patient reports that they think they have been a little manic the last few  days. Patient endorses that their friends have been saying this. Patient reports that they have been sleeping 6-8h a night. Patient reports that she has been more impulsive and on edge. Patient reports that they broke down in church yesterday due to feeling overstimulated. Patient reports that she has been feeling more anxious lately, and endorses that they feel like they think they will loose everyone. Patient reports that they were admitted to [facility] on 5/23 due to a manic episode. Patient reports that they were hospitalized for 5 days, and does not feel like it helped. Patient reports that prior to admission they were sleeping around around 3-5hrs a night and more impulsive. Patient does not remember much, but does recalls endorsing SI. Patient denies self harm recently. Patient denies SI, HI, and AVH currently. Patient reports that when she did go to [place] he did feel like she had more energy and thoughts were racing. Patient reports that she was trying to put the energy into work, but was not able to complete the many tasks that patient suddenly started.  Patient reports that their mood continues to be low, but she has also not taken the medication in 1 week, because she thought he was out. Patient reports that she still uses the hydroxyzine PRN, and has taken some as recently as yesterday. Patient reports that external factors, with dad are significantly contributing to los mood. 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 Patient reports that his relationship, has been rocky since being hospitalized.   Patient reports that his appetite is unchanged since being on  the shot. Patient reports that she is having 3-4 meals day. Patient denies purging behavior.   Patient endorses that he feels like he did not get the attention he needed at [facility].   Patient has not been taking Focalin due to running out.     08/01/2022- next dose due, of LAI she has completed the 14day PO bridge.  Patient will get appt. 6/18.   Visit Diagnosis:    ICD-10-CM   1. Borderline personality disorder (HCC)  F60.3     2. Bipolar affective disorder, currently depressed, moderate (HCC)  F31.32 ARIPiprazole ER (ABILIFY MAINTENA) 400 MG PRSY prefilled syringe    3. ADHD (attention deficit hyperactivity disorder), combined type  F90.2 guanFACINE (INTUNIV) 2 MG TB24 ER tablet    4. MDD (major depressive disorder), recurrent episode, mild (HCC)  F33.0 desvenlafaxine (PRISTIQ) 100 MG 24 hr tablet    5. Generalized anxiety disorder  F41.1 hydrOXYzine (ATARAX) 25 MG tablet      Past Psychiatric History: Borderline Personality Disorder, (10/07/2021) Major depressive disorder ADHD PTSD    Last visit: 01/2022-patient recently discharged from psychiatric hospital with medications adjusted.  Continued medications at adjusted dosages Abilify 7.5, Pristiq 100 mg daily, trazodone 100 mg nightly and hydroxyzine 25 every 6 as needed as well as Intuniv 2 mg daily and Focalin 20 mg daily   11/2021: Patient continued on Pristiq 100 mg, Abilify 5 mg, trazodone 100 mg nightly and his PCP prescribed Intuniv 2 mg and Focalin 20 mg.  Patient appeared stable at this visit.  Patient did endorse that he was having more issues with his father and was planning to move out in the next 6 months to [place] with a friend.   02/2022-decrease Abilify now that patient is doing better and is already endorsing increase appetite. Will also suggest taking in the AM as this may be part of why patient is "too activated" to get a good night rest.  Patient appears to get some benefit from his Intuniv for concentration but continues to endorse struggling completing tasks   05/2022- In DBT, in a sudden new relationship. No medication changes.  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    Past Surgical History:  Procedure Laterality Date   ADENOIDECTOMY     EYE MUSCLE SURGERY Bilateral    x3   MYRINGOTOMY WITH TUBE PLACEMENT Bilateral    TONSILLECTOMY AND ADENOIDECTOMY Bilateral 02/03/2013   Procedure: TONSILLECTOMY AND ADENOIDECTOMY;  Surgeon: Osborn Coho, MD;  Location: [facility];  Service: ENT;  Laterality: Bilateral;    Family Psychiatric History:  Father - ADHD Mother - bipolar disorder and borderline personality disorder  Family History:  Family History  Problem Relation Age of Onset   Mental illness Mother    Bipolar disorder Mother    Personality disorder Mother    Alcohol abuse Mother    Drug abuse Mother    Skin cancer Maternal Grandmother    Mental illness Paternal Grandmother    Hepatitis C Paternal Grandfather    Cirrhosis Paternal Grandfather    Heart attack Paternal Grandfather     Social History:  Social History   Socioeconomic History   Marital status: Single    Spouse  name: Not on file   Number of children: Not on file   Years of education: Not on file   Highest education level: High school graduate  Occupational History   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    He has graduated from high school   Recently quit working at [employer] Resource Strain: Not on file  Food Insecurity: Food Insecurity Present (01/14/2022)   Hunger Vital Sign    Worried About Running Out of Food in the Last Year: Sometimes true    Ran Out of Food in the Last Year: Never true  Transportation Needs: Unmet Transportation Needs (01/14/2022)   PRAPARE - Administrator, Civil Service (Medical): Yes    Lack of Transportation (Non-Medical): Yes  Physical Activity: Not on file  Stress: Not on file  Social Connections: Not on file    Allergies: No Known Allergies  Metabolic Disorder Labs: Lab Results  Component Value Date   HGBA1C 5.3 01/13/2022   MPG 105 01/13/2022   MPG 91.06 09/12/2021   Lab Results  Component Value Date   PROLACTIN 6.4 07/09/2020   PROLACTIN 22.4 (H) 04/02/2020   Lab Results  Component Value Date   CHOL 202 (H) 01/13/2022   TRIG 147 01/13/2022   HDL 30 (L) 01/13/2022   CHOLHDL 6.7 01/13/2022   VLDL 29 01/13/2022   LDLCALC 143 (H) 01/13/2022   LDLCALC 132 (H) 09/12/2021   Lab Results  Component Value Date   TSH 5.194 (H) 01/23/2022   TSH 4.557 (H) 01/13/2022    Therapeutic Level Labs: No results found for: "LITHIUM" No results found for: "VALPROATE" No results found for: "CBMZ"  Current Medications: Current Outpatient Medications  Medication Sig Dispense Refill   ARIPiprazole ER (ABILIFY MAINTENA) 400 MG PRSY prefilled syringe Inject 400 mg into the muscle every 28 (twenty-eight) days. Next dose is due 08/01/2022 1 each 11   desvenlafaxine (PRISTIQ) 100 MG 24 hr tablet Take 1 tablet (100 mg total) by mouth daily. 30 tablet 3   Estradiol Valerate 10 MG/ML OIL INJECT 0.3 ML (3 MG) ONCE WEEKLY INTO THE MUSCLE AS DIRECTED (Patient taking differently: Inject 3 mg into the skin every Sunday. INJECT 0.3 ML (3 MG) ONCE WEEKLY INTO THE MUSCLE AS DIRECTED) 5 mL 1   fluticasone (FLONASE) 50 MCG/ACT nasal spray Place 1 spray into both nostrils  daily as needed for allergies.     guanFACINE (INTUNIV) 2 MG TB24 ER tablet Take 1 tablet (2 mg total) by mouth daily. 30 tablet 3   hydrOXYzine (ATARAX) 25 MG tablet Take 1 tablet (25 mg total) by mouth every 6 (six) hours as needed for anxiety. 90 tablet 1   levocetirizine (XYZAL) 5 MG tablet  Take 5 mg by mouth every evening.     loperamide (IMODIUM) 2 MG capsule Take 1 capsule (2 mg total) by mouth 4 (four) times daily as needed for diarrhea or loose stools. 12 capsule 0   ondansetron (ZOFRAN) 4 MG tablet Take 4 mg by mouth daily as needed for nausea or vomiting.     pantoprazole (PROTONIX) 40 MG tablet Take 1 tablet (40 mg total) by mouth daily. For acid reflux (Patient taking differently: Take 40 mg by mouth daily before breakfast. For acid reflux) 30 tablet 0   potassium chloride SA (KLOR-CON M) 20 MEQ tablet Take 1 tablet (20 mEq total) by mouth daily. 7 tablet 0   progesterone (PROMETRIUM) 100 MG capsule Take 1 capsule (100 mg total) by mouth daily. 30 capsule 3   propranolol (INDERAL) 10 MG tablet Take 10 mg by mouth daily.     triamcinolone ointment (KENALOG) 0.5 % Apply 1 application. topically 2 (two) times daily. (Patient taking differently: Apply 1 application  topically 2 (two) times daily as needed (for dermatitis).) 60 g 3   No current facility-administered medications for this visit.      Psychiatric Specialty Exam: Review of Systems  Psychiatric/Behavioral:  Positive for dysphoric mood. Negative for hallucinations, sleep disturbance and suicidal ideas.     There were no vitals taken for this visit.There is no height or weight on file to calculate BMI.  General Appearance: Casual  Eye Contact:  Good  Speech:  Clear and Coherent  Volume:  Normal  Mood:  Dysphoric  Affect:  Congruent  Thought Process:  Coherent  Orientation:  Full (Time, Place, and Person)  Thought Content: Rumination   Suicidal Thoughts:  No  Homicidal Thoughts:  No  Memory:  Immediate;    Good Recent;   Good  Judgement:  Poor  Insight:  Lacking  Psychomotor Activity:  Normal  Concentration:  Concentration: Fair  Recall:  NA  Fund of Knowledge: Good  Language: Good  Akathisia:  NA  Handed:    AIMS (if indicated): not done  Assets:  Communication Skills Desire for Improvement Housing Intimacy Leisure Time Resilience Social Support  ADL's:  Intact  Cognition: WNL  Sleep:  Good   Screenings: AIMS    Flowsheet Row [facility]  AIMS Total Score 0      AUDIT    Flowsheet Row [facility]) [facility] Ad[facility] ([facility]  Alcohol Use Disorder Identification Test Final Score (AUDIT) 0 0      GAD-7    Flowsheet Row Office Visit from 07/22/2021 in [facility] [facility] Office Visit from 07/17/2021 in [facility] Office Visit from 05/27/2021 in [facility] Counselor from 05/06/2021 in [facility] Office Visit from 04/29/2021 in [facility]  Total GAD-7 Score 11 13 2 17 5       PHQ2-9    Flowsheet Row ED from 01/13/2022 in [facility] Video Visit from 08/29/2021 in [facility] Office Visit from 07/22/2021 in [facility] Office Visit from 07/17/2021 in [facility] Counselor from 05/28/2021 in [facility]  PHQ-2 Total Score  6 3 3 3 1   PHQ-9 Total Score 24 17 13 17 14       Flowsheet Row ED from 06/14/2022 in [facility] at [facility] ED from 06/11/2022 in [facility] Department at  [facility] ED from 05/12/2022 in [facility] at [facility]  C-SSRS RISK CATEGORY No Risk No Risk High Risk        Assessment and Plan: Patient endorses mood destabilization and recent hospitalization.  Based on assessment patient's presentation is concerning for borderline personality disorder traits as patient continues to have difficulty keeping stable relationships, fear of abandonment, and impulse control issues.  During patient's last hospitalization he was started on Abilify Maintena, will continue this as patient does feel has been helpful and does not wish to go back to p.o. medication.  While patient continues to endorse issues with relationships and dysphoric mood, patient has also been without her Pristiq, do feel that this should be continued for patient.  Patient has also been towards the end of her Abilify injection further contributing to possible mood destabilization in the last week.  However provider is of the opinion that many of patient's dysphoric symptoms are related to unstable interpersonal relationships and this is often seen in borderline personality disorder.  We will also discontinue patient's Focalin given patient does not have a lot of task at hand to be focused on, recent reported manic episode, and recent ED visit with concern for sinus tachycardia.  Discussed this with patient.  Will continue Intuniv for now, given multiple medication changes.  Will discontinue trazodone as patient has not been taking and still getting adequate sleep, and would like to minimize polypharmacy.  We will formally change patient's diagnosis of bipolar disorder given multiple hospitalizations this year indicating that patient does need additional medication beyond SSRI/SNRI for mood stabilization and impulse control.  Patient also presented to the most recent hospitalization endorsing decreased need for sleep and increased impulsivity.  Patient is in early 9s  and could be now presenting with first hypomanic episodes given that patient is already on some medication at times of presentation to EDs. patient also has a formal diagnosis of ADHD during childhood and there has been evidence that show patterns of progression and increase diagnoses bipolar disorder in this population as average adulthood. Qtc: 421 on 05/2022  Borderline personality disorder Gender Dysphoria Bipolar d/o, current episode depressed PTSD (stable) - Continue Abilify Maintenna 400mg  q28days, - Continue taking Pristiq 100 mg daily - discontinue Trazodone 100mg  QHS  GAD - Continue hydroxyzine 25 mg every 6 hours as needed - Pristiq 100 mg daily  ADHD - Continue Intuniv 2 mg T24 ER daily - discontinue Focalin 20 mg daily,    Collaboration of Care: Collaboration of Care:   Patient/Guardian was advised Release of Information must be obtained prior to any record release in order to collaborate their care with an outside provider. Patient/Guardian was advised if they have not already done so to contact the registration department to sign all necessary forms in order for [place] to release information regarding their care.   Consent: Patient/Guardian gives verbal consent for treatment and assignment of benefits for services provided during this visit. Patient/Guardian expressed understanding and agreed to proceed.   PGY-3 Bobbye Morton, MD 07/29/2022, 11:12 AM

## 2022-07-31 ENCOUNTER — Telehealth (HOSPITAL_COMMUNITY): Payer: Medicaid Other | Admitting: Student in an Organized Health Care Education/Training Program

## 2022-08-04 ENCOUNTER — Ambulatory Visit (INDEPENDENT_AMBULATORY_CARE_PROVIDER_SITE_OTHER): Payer: Medicaid Other

## 2022-08-04 ENCOUNTER — Encounter (HOSPITAL_COMMUNITY): Payer: Self-pay

## 2022-08-04 VITALS — BP 99/68 | HR 78 | Resp 16 | Ht 67.0 in | Wt 190.8 lb

## 2022-08-04 DIAGNOSIS — F3132 Bipolar disorder, current episode depressed, moderate: Secondary | ICD-10-CM

## 2022-08-04 MED ORDER — ARIPIPRAZOLE ER 400 MG IM PRSY
400.0000 mg | PREFILLED_SYRINGE | Freq: Once | INTRAMUSCULAR | Status: AC
Start: 2022-08-04 — End: 2022-08-04
  Administered 2022-08-04: 400 mg via INTRAMUSCULAR

## 2022-08-04 NOTE — Progress Notes (Cosign Needed)
PATIENT PRESENTS TO THE OFFICE FOR ABILIFY MAINTENNA 400 MG INJECTION GIVEN BY Kendry Pfarr , PT TOLERATED INJECTION WELL IN RIGHT UPPER OUTAR GLUT AND WILL RETURN IN 28 DAYS

## 2022-08-23 ENCOUNTER — Ambulatory Visit (HOSPITAL_COMMUNITY)
Admission: EM | Admit: 2022-08-23 | Discharge: 2022-08-25 | Disposition: A | Discharge status: Home / Self Care | Payer: MEDICAID | Attending: Physician Assistant | Admitting: Physician Assistant

## 2022-08-23 DIAGNOSIS — Z1152 Encounter for screening for COVID-19: Secondary | ICD-10-CM | POA: Insufficient documentation

## 2022-08-23 DIAGNOSIS — R45851 Suicidal ideations: Secondary | ICD-10-CM | POA: Insufficient documentation

## 2022-08-23 DIAGNOSIS — F3130 Bipolar disorder, current episode depressed, mild or moderate severity, unspecified: Secondary | ICD-10-CM | POA: Insufficient documentation

## 2022-08-23 DIAGNOSIS — F640 Transsexualism: Secondary | ICD-10-CM | POA: Insufficient documentation

## 2022-08-23 DIAGNOSIS — F3132 Bipolar disorder, current episode depressed, moderate: Secondary | ICD-10-CM

## 2022-08-23 LAB — TSH: TSH: 3.335 u[IU]/mL (ref 0.350–4.500)

## 2022-08-23 LAB — SARS CORONAVIRUS 2 BY RT PCR: SARS Coronavirus 2 by RT PCR: NEGATIVE

## 2022-08-23 LAB — LIPID PANEL
Cholesterol: 167 mg/dL (ref 0–200)
HDL: 31 mg/dL — ABNORMAL LOW (ref >40)
LDL Cholesterol: 102 mg/dL — ABNORMAL HIGH (ref 0–99)
Total CHOL/HDL Ratio: 5.4 ratio
Triglycerides: 171 mg/dL — ABNORMAL HIGH (ref <150)
VLDL: 34 mg/dL (ref 0–40)

## 2022-08-23 MED ORDER — VENLAFAXINE HCL ER 37.5 MG PO CP24
37.5000 mg | ORAL_CAPSULE | Freq: Every day | ORAL | Status: DC
  Filled 2022-08-23: qty 1

## 2022-08-23 MED ORDER — VENLAFAXINE HCL ER 37.5 MG PO CP24: 37.5000 mg | ORAL_CAPSULE | Freq: Every day | ORAL | Status: DC

## 2022-08-23 MED ORDER — ACETAMINOPHEN 325 MG PO TABS: 650.0000 mg | ORAL_TABLET | Freq: Four times a day (QID) | ORAL | Status: DC | PRN

## 2022-08-23 MED ORDER — TRAZODONE HCL 50 MG PO TABS
50.0000 mg | ORAL_TABLET | Freq: Every evening | ORAL | Status: DC | PRN
  Filled 2022-08-23: qty 1

## 2022-08-23 MED ORDER — MAGNESIUM HYDROXIDE 400 MG/5ML PO SUSP: 30.0000 mL | Freq: Every day | ORAL | Status: DC | PRN

## 2022-08-23 MED ORDER — ALUM & MAG HYDROXIDE-SIMETH 200-200-20 MG/5ML PO SUSP: 30.0000 mL | ORAL | Status: DC | PRN

## 2022-08-23 MED ORDER — HYDROXYZINE HCL 25 MG PO TABS
25.0000 mg | ORAL_TABLET | Freq: Three times a day (TID) | ORAL | Status: DC | PRN
  Administered 2022-08-24: 25 mg via ORAL
  Filled 2022-08-23 (×2): qty 1

## 2022-08-23 NOTE — ED Provider Notes (Signed)
Patient is normally supposed to be taking Pristiq 100 mg daily but has not been on their medication due to running out.  Provider to restart patient's medication; however, patient is to be placed on venlafaxine XR 37.5 mg due to being unable to order Pristiq at a lower dose.  Recommendation for patient to be restarted on Pristiq following discharge from this facility.
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
## 2022-08-23 NOTE — ED Notes (Signed)
Patient observed/assessed in bed/chair resting quietly appearing in no distress and verbalizing no complaints at this time. Will continue to monitor.  

## 2022-08-24 LAB — POCT URINE DRUG SCREEN - MANUAL ENTRY (I-SCREEN)
POC Amphetamine UR: NOT DETECTED
POC Buprenorphine (BUP): NOT DETECTED
POC Cocaine UR: NOT DETECTED
POC Marijuana UR: NOT DETECTED
POC Methadone UR: NOT DETECTED
POC Methamphetamine UR: NOT DETECTED
POC Morphine: NOT DETECTED
POC Oxazepam (BZO): NOT DETECTED
POC Oxycodone UR: NOT DETECTED
POC Secobarbital (BAR): NOT DETECTED

## 2022-08-24 MED ORDER — LOPERAMIDE HCL 2 MG PO CAPS
2.0000 mg | ORAL_CAPSULE | Freq: Once | ORAL | Status: AC
  Administered 2022-08-24: 2 mg via ORAL
  Filled 2022-08-24: qty 1

## 2022-08-24 MED ORDER — VENLAFAXINE HCL ER 37.5 MG PO CP24
37.5000 mg | ORAL_CAPSULE | Freq: Every day | ORAL | Status: DC
  Administered 2022-08-25: 37.5 mg via ORAL
  Filled 2022-08-24: qty 1

## 2022-08-24 MED ORDER — BISMUTH SUBSALICYLATE 262 MG PO CHEW
524.0000 mg | CHEWABLE_TABLET | Freq: Once | ORAL | Status: AC
  Administered 2022-08-24: 524 mg via ORAL
  Filled 2022-08-24: qty 2

## 2022-08-24 MED ORDER — ONDANSETRON 4 MG PO TBDP
4.0000 mg | ORAL_TABLET | Freq: Once | ORAL | Status: AC
  Administered 2022-08-24: 4 mg via ORAL
  Filled 2022-08-24: qty 1

## 2022-08-24 MED ORDER — BISMUTH SUBSALICYLATE 262 MG/15ML PO SUSP
30.0000 mL | Freq: Once | ORAL | Status: DC
  Filled 2022-08-24: qty 118

## 2022-08-24 NOTE — ED Notes (Signed)
Patient observed resting quietly. Respirations equal and unlabored. Patient requested to speak with the provider regarding discharge. Provider made aware. Will continue to monitor for safety.

## 2022-08-24 NOTE — ED Notes (Signed)
Pt sleeping@this  time. Breathing even and unlabored. Alert and orient x 4. No c/o pain or distress will continue to monitor for safety

## 2022-08-24 NOTE — ED Notes (Signed)
Pt sleeping@this time. Breathing even and unlabored. Will continue to monitor for safety 

## 2022-08-24 NOTE — ED Notes (Signed)
Patient observed/assessed in bed/chair resting quietly appearing in no distress and verbalizing no complaints at this time. Will continue to monitor.  

## 2022-08-24 NOTE — ED Notes (Signed)
Patient observed resting quietly, eyes closed. Respirations equal and unlabored. Will continue to monitor for safety.  

## 2022-08-24 NOTE — ED Notes (Addendum)
Patient is anxious. PRN antianxiety med given. Patient continues to inquire about his Pristiq. Message sent to pharmacy. Pt is cooperative and calm at this moment. Will continue to monitor for safety.

## 2022-08-24 NOTE — Care Management (Signed)
Care Management   Patient has jury duty on August 25, 2022.  Writer was not able to reach anyone in the [employer].  I was only able to leave a voice mail message.    Writer met with the patient and let the patient know that she will have to continue to call the court house regarding her July 9 jury summons.   Writer informed the MD working with the patient.

## 2022-08-24 NOTE — ED Notes (Signed)
The patient brought to this nurses attention that she got food poisoning from a burger she got on yesterday and is now experiencing diarrhea. This nurse made the provider aware that the patient may need medication relief.

## 2022-08-24 NOTE — ED Notes (Signed)
Patient A&Ox4. Patient denies SI/HI and AVH, Patient denies any physical complaints when asked. No acute distress noted. Support and encouragement provided. Routine safety checks conducted according to facility protocol. Encouraged patient to notify staff if thoughts of harm toward self or others arise. Patient verbalize understanding and agreement. Will continue to monitor for safety.

## 2022-08-24 NOTE — ED Notes (Signed)
Patient is requesting to leave. Stating being her is making everything worse. Provider made aware. However, while writing this note, pt stated "never mind, my dad won't pick me up". Will relay this to the provider.
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
## 2022-08-24 NOTE — Discharge Instructions (Addendum)
Follow-up recommendations:  Activity:  Normal, as tolerated Diet:  Per PCP recommendation Continue taking Pristiq 100 mg daily, sent home with samples   Patient is instructed prior to discharge to: Take all medications as prescribed by her mental healthcare provider. Report any adverse effects and/or reactions from the medicines to her outpatient provider promptly. Patient has been instructed & cautioned: To not engage in alcohol and or illegal drug use while on prescription medicines.  In the event of worsening symptoms, patient is instructed to call the crisis hotline at 988, 911 and or go to the nearest ED for appropriate evaluation and treatment of symptoms. To follow-up with her primary care provider for your other medical issues, concerns and or health care needs.   Base on the information you have provided and the presenting issue, outpatient services and resources for have been recommended.  It is imperative that you follow through with treatment recommendations within 5-7 days from the of discharge to mitigate further risk to your safety and mental well-being. A list of referrals has been provided below to get you started.  You are not limited to the list provided.  In case of an urgent crisis, you may contact the Mobile Crisis Unit with [facility] at [phone].    [facility] [address], [place], [address] [phone] phone   New Patient Assessment/Therapy Walk-Ins:  Monday and Wednesday: 8 am until slots are full. Every 1st and 2nd Fridays of the month: 1 pm - 5 pm.  NO ASSESSMENT/THERAPY WALK-INS ON TUESDAYS OR THURSDAYS  New Patient Assessment/Medication Management Walk-Ins:  Monday - Friday:  8 am - 11 am.  For all walk-ins, we ask that you arrive by 7:30 am because patients will be seen in the order of arrival.  Availability is limited; therefore, you may not be seen on the same day that you walk-in.  Our goal is  to serve and meet the needs of our community to the best of our ability.  TRANSPORTATION To get a ride to your medical appointment, call your your Non-Emergency Medical Transportation [facility]) provider. Your NEMT provider is based on the health plan you are enrolled in. When your appointment is made, please call your NEMT provider the same day, to schedule your ride.  [place] [employer] Direct and EBCI Editor, commissioning of Social Services (DSS) to schedule a ride. DSS Transportation [address] [place] [address] [phone]  Others #s: [phone] Office Hours 8:00 am to 5:00 pm Monday - Friday     [facility] [phone]  [facility] Complete [phone]  [facility] [place] [phone]  [employer] Plan [phone]  [facility] [phone]   [employer] - Another resource for a transport system that serves several counties. [employer]

## 2022-08-25 LAB — HEMOGLOBIN A1C
Hgb A1c MFr Bld: 5.1 % (ref 4.8–5.6)
Mean Plasma Glucose: 100 mg/dL

## 2022-08-25 MED ORDER — DESVENLAFAXINE SUCCINATE ER 50 MG PO TB24
100.0000 mg | ORAL_TABLET | Freq: Every day | ORAL | Status: DC
  Filled 2022-08-25 (×6): qty 1

## 2022-08-25 NOTE — ED Provider Notes (Signed)
FBC/OBS ASAP Discharge Summary  Date and Time: 08/25/2022 11:36 AM  Name: Scott [name]  MRN:  [id number]   Discharge Diagnoses:  Final diagnoses:  Bipolar affective disorder, currently depressed, moderate (HCC)  Passive suicidal ideations    Subjective: Scott [name] is feeling improved this morning. Slept okay overall. GI symptoms have improved. Denies any current  SI/HI/AVH.   Collateral, Dad,   Dad is okay, with [name] coming by the home to pick up her belongings. Just desires for Scott [name] to be safe, told this Clinical research associate, that he would leave a key to allow [name] back in the house this afternoon to gather her things and medications.   Stay Summary:   The patient was evaluated each day by a clinical provider to ascertain response to treatment. Improvement was noted by the patient's report of decreasing symptoms, improved sleep and appetite, affect, medication tolerance, behavior, and participation in unit programming.  Patient was asked each day to complete a self inventory noting mood, mental status, pain, new symptoms, anxiety and concerns.  The patient's medications were managed with the following directions: She was transitioned to Effexor XR 37.5 mg daily while in the obs unit, due to inability to order in facility   Discharged home with samples of Pristiq to bridge into next outpatient appointment Dr. Cyndie Chime    Patient responded well to medication and being in a therapeutic and supportive environment. Positive and appropriate behavior was noted and the patient was motivated for recovery. The patient worked closely with the treatment team and case manager to develop a discharge plan with appropriate goals. Coping skills, problem solving as well as relaxation therapies were also part of the unit programming.   Total Time spent with patient: 20 minutes  Past Psychiatric History: Borderline personality disorder, gender dysphoria, MDD, GAD, PTSD, ADHD Past Medical History: Eczema, S; Family  History: N/A Family Psychiatric  History: Father ADHD, Mother borderline personality disorder, EtOH and polysubstance abuse Social History:    Transgendered male. Therapy through [facility]. Currently living with girlfriend and friends. Main social support through girlfriend and friends. Unemployed. No access to weapons.  Pain Medications: Please see MAR Prescriptions: Please see MAR Over the Counter: Please see MAR History of alcohol / drug use?: Yes Longest period of sobriety (when/how long): Unknown Negative Consequences of Use:  (None) Withdrawal Symptoms: None Name of Substance 1: Marijuana 1 - Age of First Use: 22 1 - Amount (size/oz): Varies 1 - Frequency: Approximately once per month 1 - Duration: Ongoing 1 - Last Use / Amount: 1 week ago 1 - Method of Aquiring: Unknown 1- Route of Use: Smoke inhalation    Current Medications:  Current Facility-Administered Medications  Medication Dose Route Frequency Provider Last Rate Last Admin   acetaminophen (TYLENOL) tablet 650 mg  650 mg Oral Q6H PRN Nwoko, Uchenna E, PA       alum & mag hydroxide-simeth (MAALOX/MYLANTA) 200-200-20 MG/5ML suspension 30 mL  30 mL Oral Q4H PRN Nwoko, Uchenna E, PA       hydrOXYzine (ATARAX) tablet 25 mg  25 mg Oral TID PRN Nwoko, Uchenna E, PA   25 mg at 08/24/22 1808   magnesium hydroxide (MILK OF MAGNESIA) suspension 30 mL  30 mL Oral Daily PRN Nwoko, Uchenna E, PA       traZODone (DESYREL) tablet 50 mg  50 mg Oral QHS PRN Nwoko, Uchenna E, PA       venlafaxine XR (EFFEXOR-XR) 24 hr capsule 37.5 mg  37.5 mg Oral Q  breakfast Peterson Ao, MD   37.5 mg at 08/25/22 4098   Current Outpatient Medications  Medication Sig Dispense Refill   ARIPiprazole ER (ABILIFY MAINTENA) 400 MG PRSY prefilled syringe Inject 400 mg into the muscle every 28 (twenty-eight) days. Next dose is due 08/01/2022 1 each 11   desvenlafaxine (PRISTIQ) 100 MG 24 hr tablet Take 1 tablet (100 mg total) by mouth daily. 30  tablet 3   dexmethylphenidate (FOCALIN) 10 MG tablet Take 20 mg by mouth daily.     Estradiol Valerate 10 MG/ML OIL INJECT 0.3 ML (3 MG) ONCE WEEKLY INTO THE MUSCLE AS DIRECTED (Patient taking differently: Inject 3 mg into the skin every Sunday.) 5 mL 1   guanFACINE (INTUNIV) 2 MG TB24 ER tablet Take 1 tablet (2 mg total) by mouth daily. 30 tablet 3   hydrOXYzine (ATARAX) 25 MG tablet Take 1 tablet (25 mg total) by mouth every 6 (six) hours as needed for anxiety. 90 tablet 1   levocetirizine (XYZAL) 5 MG tablet Take 5 mg by mouth every evening.     loperamide (IMODIUM) 2 MG capsule Take 1 capsule (2 mg total) by mouth 4 (four) times daily as needed for diarrhea or loose stools. 12 capsule 0   ondansetron (ZOFRAN) 4 MG tablet Take 4 mg by mouth daily as needed for nausea or vomiting.     pantoprazole (PROTONIX) 40 MG tablet Take 1 tablet (40 mg total) by mouth daily. For acid reflux 30 tablet 0   progesterone (PROMETRIUM) 100 MG capsule Take 1 capsule (100 mg total) by mouth daily. 30 capsule 3   propranolol ER (INDERAL LA) 60 MG 24 hr capsule Take 60 mg by mouth daily.     traZODone (DESYREL) 100 MG tablet Take 100 mg by mouth at bedtime.     triamcinolone ointment (KENALOG) 0.5 % Apply 1 application. topically 2 (two) times daily. (Patient taking differently: Apply 1 application  topically 2 (two) times daily as needed (for dermatitis).) 60 g 3    PTA Medications:  Facility Ordered Medications  Medication   acetaminophen (TYLENOL) tablet 650 mg   alum & mag hydroxide-simeth (MAALOX/MYLANTA) 200-200-20 MG/5ML suspension 30 mL   magnesium hydroxide (MILK OF MAGNESIA) suspension 30 mL   hydrOXYzine (ATARAX) tablet 25 mg   traZODone (DESYREL) tablet 50 mg   [COMPLETED] loperamide (IMODIUM) capsule 2 mg   [COMPLETED] bismuth subsalicylate (PEPTO BISMOL) chewable tablet 524 mg   [COMPLETED] ondansetron (ZOFRAN-ODT) disintegrating tablet 4 mg   [COMPLETED] loperamide (IMODIUM) capsule 2 mg    venlafaxine XR (EFFEXOR-XR) 24 hr capsule 37.5 mg   PTA Medications  Medication Sig   triamcinolone ointment (KENALOG) 0.5 % Apply 1 application. topically 2 (two) times daily. (Patient taking differently: Apply 1 application  topically 2 (two) times daily as needed (for dermatitis).)   progesterone (PROMETRIUM) 100 MG capsule Take 1 capsule (100 mg total) by mouth daily.   Estradiol Valerate 10 MG/ML OIL INJECT 0.3 ML (3 MG) ONCE WEEKLY INTO THE MUSCLE AS DIRECTED (Patient taking differently: Inject 3 mg into the skin every Sunday.)   pantoprazole (PROTONIX) 40 MG tablet Take 1 tablet (40 mg total) by mouth daily. For acid reflux   levocetirizine (XYZAL) 5 MG tablet Take 5 mg by mouth every evening.   ondansetron (ZOFRAN) 4 MG tablet Take 4 mg by mouth daily as needed for nausea or vomiting.   loperamide (IMODIUM) 2 MG capsule Take 1 capsule (2 mg total) by mouth 4 (four) times daily as  needed for diarrhea or loose stools.   ARIPiprazole ER (ABILIFY MAINTENA) 400 MG PRSY prefilled syringe Inject 400 mg into the muscle every 28 (twenty-eight) days. Next dose is due 08/01/2022   desvenlafaxine (PRISTIQ) 100 MG 24 hr tablet Take 1 tablet (100 mg total) by mouth daily.   guanFACINE (INTUNIV) 2 MG TB24 ER tablet Take 1 tablet (2 mg total) by mouth daily.   hydrOXYzine (ATARAX) 25 MG tablet Take 1 tablet (25 mg total) by mouth every 6 (six) hours as needed for anxiety.   propranolol ER (INDERAL LA) 60 MG 24 hr capsule Take 60 mg by mouth daily.   traZODone (DESYREL) 100 MG tablet Take 100 mg by mouth at bedtime.   dexmethylphenidate (FOCALIN) 10 MG tablet Take 20 mg by mouth daily.       01/13/2022    6:24 PM 08/29/2021   10:15 AM 07/22/2021    2:48 PM  Depression screen PHQ 2/9  Decreased Interest 3 2 1   Down, Depressed, Hopeless 3 1 2   PHQ - 2 Score 6 3 3   Altered sleeping 3 1 2   Tired, decreased energy 3 1 1   Change in appetite 3 3 2   Feeling bad or failure about yourself  3 3 3   Trouble  concentrating 3 3 2   Moving slowly or fidgety/restless 0 3 0  Suicidal thoughts 3 0   PHQ-9 Score 24 17 13   Difficult doing work/chores Extremely dIfficult Extremely dIfficult     Flowsheet Row ED from 08/23/20[facility] from 06/14/20[facility] in [facility] ED from 06/11/2022 in [facility] Emergency Department at [facility]  C-SSRS RISK CATEGORY Moderate Risk No Risk No Risk       Musculoskeletal  Strength & Muscle Tone: within normal limits Gait & Station: normal Patient leans: N/A  Psychiatric Specialty Exam  Presentation  General Appearance:  Appropriate for Environment; Casual  Eye Contact: Fair  Speech: Clear and Coherent; Normal Rate  Speech Volume: Decreased  Handedness: Right   Mood and Affect  Mood: Depressed  Affect: Depressed; Flat; Congruent   Thought Process  Thought Processes: Linear  Descriptions of Associations:Intact  Orientation:Full (Time, Place and Person)  Thought Content:WDL  Diagnosis of Schizophrenia or Schizoaffective disorder in past: No    Hallucinations:Hallucinations: None  Ideas of Reference:None  Suicidal Thoughts:Suicidal Thoughts: No  Homicidal Thoughts:Homicidal Thoughts: No   Sensorium  Memory: Immediate Fair; Recent Fair  Judgment: Poor  Insight: Fair   Chartered certified accountant: Fair  Attention Span: Good  Recall: Fair  Fund of Knowledge: Fair  Language: Fair   Psychomotor Activity  Psychomotor Activity: Psychomotor Activity: Normal   Assets  Assets: Intimacy   Sleep  Sleep: Sleep: Fair Number of Hours of Sleep: 6   Nutritional Assessment (For OBS and FBC admissions only) Has the patient had a weight loss or gain of 10 pounds or more in the last 3 months?: No Has the patient had a decrease in food intake/or appetite?: No Does the patient have dental problems?: No Does the  patient have eating habits or behaviors that may be indicators of an eating disorder including binging or inducing vomiting?: No Has the patient recently lost weight without trying?: 0 Has the patient been eating poorly because of a decreased appetite?: 0 Malnutrition Screening Tool Score: 0    Physical Exam  Physical Exam ROS Blood pressure 131/86, pulse 97, temperature 98.7 F (37.1 C), temperature source  Oral, resp. rate 18, SpO2 99 %. There is no height or weight on file to calculate BMI.  Demographic Factors:  Adolescent or young adult, Gay, lesbian, or bisexual orientation, Low socioeconomic status, and Unemployed  Loss Factors: Financial problems/change in socioeconomic status  Historical Factors: Prior suicide attempts, Family history of mental illness or substance abuse, and Impulsivity  Risk Reduction Factors:   Positive social support  Continued Clinical Symptoms:  Depression:   Hopelessness Personality Disorders:  Borderline personality disorder Previous Psychiatric Diagnoses and Treatments  Cognitive Features That Contribute To Risk:  Closed-mindedness    Suicide Risk:  Mild:  Suicidal ideation of limited frequency, intensity, duration, and specificity.  There are no identifiable plans, no associated intent, mild dysphoria and related symptoms, good self-control (both objective and subjective assessment), few other risk factors, and identifiable protective factors, including available and accessible social support.  Plan Of Care/Follow-up recommendations:    Treatment Plan Summary: DARIS MURACH  "Scott [name]" is a transgendered male who was admitted to [facility] for observations due to suicidal thoughts with the plan to overdose trazodone.      During morning assessment patient reported improved mood symptoms. Desires discharge home. Safety planning conducted with roommates and the patient prior. Patient spoke with dad, about picking up something's from home and he  was amenable. This Clinical research associate discussed, with father also and he was agreeable and that [name] needed to coordinate ride with friends for pick this afternoon. Patient provided samples of Pristiq to bridge until medication follow-up appointment with Dr. Laveda Norman and instructions on how to arrange transportation for appointments going forward. Excused absence email attempted to be sent to [employer].    Bipolar:  Patient recently hospitalized at [facility] on 06/2022 and started on aripiprazole 400mg  for considerations of bipolar. The patient requested LAI at that time. - aripiprazole 400mg  IM  - Most recent injection, 08/04/2022     Generalized Anxiety:  - Discontinued venlafxine XR 37.5 mg, unable to order pristiq - Will resume Prestiq 100 mg  at discharge, ran out recently due to inability to pick up from pharmacy due to transportation    Depression:  - Discontinued venlafxine XR 37.5 mg, unable to order pristiq - - Given samples of Prestiq 100 mg  at discharge,,  ran out recently due to inability to pick up from pharmacy due to transportation    PTSD:  - Discontinued venlafxine XR 37.5 mg, unable to order pristiq -Therapeutic services weekly with [facility]  - - Will resume Prestiq 100 mg  at discharge,, ran out recently due to inability to pick up from pharmacy due transportatoin    Gender Dysphoria:  Patient currently transitioning from male to male - Estradiol injections    Borderline Personality disorder  Patient in therapy with [facility], weekly   Disposition: Discharge home   Peterson Ao, MD 08/25/2022, 11:36 AM

## 2022-08-25 NOTE — ED Notes (Signed)
Patient A&O x 4, ambulatory. Patient discharged in no acute distress. Patient denied SI/HI, A/VH upon discharge. Patient verbalized understanding of all discharge instructions explained by staff, to include follow up appointments, RX's and safety plan. Patient reported mood 10/10.  Pt belongings returned to patient from locker #28 intact. Patient escorted to lobby via staff for transport to destination. Safety maintained.  

## 2022-08-25 NOTE — ED Notes (Signed)
Patient itting on bed talking with peer.  Respirations equal and unlabored, skin warm and dry, NAD. Routine safety checks conducted according to facility protocol. Will continue to monitor for safety.

## 2022-08-25 NOTE — ED Notes (Signed)
Pt sleeping at present, no distress noted.  Monitoring for safety. 

## 2022-08-25 NOTE — ED Notes (Signed)
Patient A&Ox4. Patient denies SI/HI and AVH. Patient denies any physical complaints when asked. No acute distress noted. Support and encouragement provided. Routine safety checks conducted according to facility protocol. Encouraged patient to notify staff if thoughts of harm toward self or others arise. Patient verbalize understanding and agreement. Will continue to monitor for safety.    

## 2022-08-28 ENCOUNTER — Telehealth (HOSPITAL_COMMUNITY): Payer: MEDICAID | Admitting: Student in an Organized Health Care Education/Training Program

## 2022-08-28 ENCOUNTER — Telehealth (HOSPITAL_COMMUNITY): Payer: MEDICAID | Admitting: Student

## 2022-08-28 DIAGNOSIS — F603 Borderline personality disorder: Secondary | ICD-10-CM | POA: Diagnosis not present

## 2022-08-28 DIAGNOSIS — F649 Gender identity disorder, unspecified: Secondary | ICD-10-CM

## 2022-08-28 DIAGNOSIS — F411 Generalized anxiety disorder: Secondary | ICD-10-CM

## 2022-08-28 MED ORDER — DESVENLAFAXINE SUCCINATE ER 100 MG PO TB24: 100.0000 mg | ORAL_TABLET | Freq: Every day | ORAL | 3 refills | Status: DC

## 2022-08-28 NOTE — Progress Notes (Signed)
Virtual Visit via Video Note  I connected with Scott [name], "[name]" on 08/28/22 at 10:00 AM EDT by a video enabled telemedicine application and verified that I am speaking with the correct person using two identifiers.  Location: Patient: Home Provider: Office   I discussed the limitations of evaluation and management by telemedicine and the availability of in person appointments. The patient expressed understanding and agreed to proceed.   I discussed the assessment and treatment plan with the patient. The patient was provided an opportunity to ask questions and all were answered. The patient agreed with the plan and demonstrated an understanding of the instructions.   The patient was advised to call back or seek an in-person evaluation if the symptoms worsen or if the condition fails to improve as anticipated.  I provided 45 minutes of non-face-to-face time during this encounter.  Princess Bruins, DO  Psych Resident PGY-3  [facility] MD/PA/NP OP Progress Note  08/28/2022 2:44 PM Scott [name]  MRN:  [id number]  Chief Complaint:  Chief Complaint  Patient presents with   Borderline Personality   HPI: Scott [name] "[name]" is a 22 y.o. transgender male to male with a past psychiatric history significant for borderline personality, gender dysphoria, PTSD, major depressive disorder, generalized anxiety disorder, who presented virtual as a follow-up   Patient was recently seen at [facility] for active SI with plan to OD on trazodone on 08/23/2022. Stated that she likely has DID, stated that one of her alters, Drema Balzarine, told her that she needed to go the hospital/[facility]    Stated that she believes that patient has been in a "manic state" for the past 3 years because of everything is chaotic, during this time patient stated that she overdosed on zoloft and was not the same after the overdose.  Reported that "mania" to her is being erratic, moody, being very emotional, taking advantage of people,  having suicidal ideation, not following through with what she says she will do with friends/peers.   Reported poor sleep, stated she doesn't get enough. Reported trying to go to bed at 10-11pm and able to fall asleep quickly. Reported waking up 2-3x a night over the past week, is up for to 1hr.  Reported drinking coffee in the AM. Reported drinking tea and soda throughout the day, with the last one being last night - cream soda - non- caffeinated Reported napping to an hour mins.  Sleep talking, denied snoring, confirmed that partner has not made any comments about patient snoring  Appetite has been great.  Patient denied active and passive SI. Last SI was Thursday. Denied HI, AVH, paranoia.  Current medication regimen: Abilify Maintenna IM 400mg  q28days - the shot is due tuesday Pristiq 100mg  daily  Propanolol LA 60mg  rx by PCP for HTN Intuniv 2mg  t24 daily still have Hydroxyzine 25mg  q6h PRN  Dc'd last time Trazodone 100mg  QHS (not taking) Focalin 20mg  daily (ran out)  Visit Diagnosis:    ICD-10-CM   1. Gender dysphoria  F64.9     2. MDD (major depressive disorder), recurrent episode, mild (HCC)  F33.0        Past Psychiatric History:  Borderline Personality Disorder, (10/07/2021) Major depressive disorder GAD ADHD PTSD    Last visit: 01/2022-patient recently discharged from psychiatric hospital with medications adjusted.  Continued medications at adjusted dosages Abilify 7.5, Pristiq 100 mg daily, trazodone 100 mg nightly and hydroxyzine 25 every 6 as needed as well as Intuniv 2 mg daily and Focalin  20 mg daily   11/2021: Patient continued on Pristiq 100 mg, Abilify 5 mg, trazodone 100 mg nightly and his PCP prescribed Intuniv 2 mg and Focalin 20 mg.  Patient appeared stable at this visit.  Patient did endorse that he was having more issues with his father and was planning to move out in the next 6 months to [place] with a friend.   02/2022-decrease Abilify  now that patient is doing better and is already endorsing increase appetite. Will also suggest taking in the AM as this may be part of why patient is "too activated" to get a good night rest.  Patient appears to get some benefit from his Intuniv for concentration but continues to endorse struggling completing tasks   05/2022- In DBT, in a sudden new relationship. No medication changes.  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    Past Surgical History:  Procedure Laterality Date   ADENOIDECTOMY     EYE MUSCLE SURGERY Bilateral    x3   MYRINGOTOMY WITH TUBE PLACEMENT Bilateral    TONSILLECTOMY AND ADENOIDECTOMY Bilateral 02/03/2013   Procedure: TONSILLECTOMY AND ADENOIDECTOMY;  Surgeon: Osborn Coho, MD;  Location: [facility];  Service: ENT;  Laterality: Bilateral;    Family Psychiatric History:  Father - ADHD Mother - bipolar disorder and borderline personality disorder  Family History:  Family History  Problem Relation Age of Onset   Mental illness Mother    Bipolar disorder Mother    Personality disorder Mother    Alcohol abuse Mother    Drug abuse Mother    Skin cancer Maternal Grandmother    Mental illness Paternal Grandmother    Hepatitis C Paternal Grandfather    Cirrhosis Paternal Grandfather    Heart attack Paternal Grandfather     Social History:  Social History   Socioeconomic History   Marital status: Single    Spouse name: Not on file   Number of children: Not on file   Years of education: Not on file   Highest education level: High school graduate  Occupational History   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    He has graduated from high school   Recently quit working at [employer]: Low Risk  (03/06/2022)   Received from [facility], [facility]   Overall Financial Resource Strain (CARDIA)    Difficulty of Paying Living Expenses: Not hard at all  Recent Concern: Financial  Resource Strain - Medium Risk (12/30/2021)   Received from [facility], [facility]   Overall Financial Resource Strain (CARDIA)    Difficulty of Paying Living Expenses: Somewhat hard  Food Insecurity: Patient Declined (07/04/2022)   Received from [facility], [facility]   Hunger Vital Sign    Worried About Running Out of Food in the Last Year: Patient declined    Ran Out of Food in the Last Year: Patient declined  Transportation Needs: No Transportation Needs (07/09/2022)   Received from [employer], [facility]   PRAPARE - Transportation    Lack of Transportation (Medical): No    Lack of Transportation (Non-Medical): No  Physical Activity: Unknown (03/06/2022)   Received from [facility], [facility]   Exercise Vital Sign    Days of Exercise per Week: 0 days    Minutes of Exercise per Session: Not on file  Stress: Patient Declined (07/04/2022)   Received from [facility], [facility] - Occupational Stress Questionnaire    Feeling of Stress :  Patient declined  Social Connections: Socially Isolated (03/06/2022)   Received from [facility], [facility]   Social Network    How would you rate your social network (family, work, friends)?: Little participation, lonely and socially isolated   Allergies: No Known Allergies  Metabolic Disorder Labs: Lab Results  Component Value Date   HGBA1C 5.1 08/23/2022   MPG 100 08/23/2022   MPG 105 01/13/2022   Lab Results  Component Value Date   PROLACTIN 6.4 07/09/2020   PROLACTIN 22.4 (H) 04/02/2020   Lab Results  Component Value Date   CHOL 167 08/23/2022   TRIG 171 (H) 08/23/2022   HDL 31 (L) 08/23/2022   CHOLHDL 5.4 08/23/2022   VLDL 34 08/23/2022   LDLCALC 102 (H) 08/23/2022   LDLCALC 143 (H) 01/13/2022   Lab Results  Component Value Date   TSH 3.335 08/23/2022   TSH 5.194 (H) 01/23/2022   Therapeutic Level Labs: No results found for: "LITHIUM" No results found for: "VALPROATE" No results found for: "CBMZ"  Current Medications: Current Outpatient Medications  Medication Sig Dispense Refill   ARIPiprazole ER (ABILIFY MAINTENA) 400 MG PRSY prefilled syringe Inject 400 mg into the muscle every 28 (twenty-eight) days. Next dose is due 08/01/2022 1 each 11   desvenlafaxine (PRISTIQ) 100 MG 24 hr tablet Take 1 tablet (100 mg total) by mouth daily. 30 tablet 3   Estradiol Valerate 10 MG/ML OIL INJECT 0.3 ML (3 MG) ONCE WEEKLY INTO THE MUSCLE AS DIRECTED (Patient taking differently: Inject 3 mg into the skin every Sunday.) 5 mL 1   guanFACINE (INTUNIV) 2 MG TB24 ER tablet Take 1 tablet (2 mg total) by mouth daily. 30 tablet 3   hydrOXYzine (ATARAX) 25 MG tablet Take 1 tablet (25 mg total) by mouth every 6 (six) hours as needed for anxiety. 90 tablet 1   progesterone (PROMETRIUM) 100 MG capsule Take 1 capsule (100 mg total) by mouth daily. 30 capsule 3   propranolol ER (INDERAL LA) 60 MG 24 hr capsule Take 60 mg by mouth daily.     triamcinolone ointment (KENALOG) 0.5 % Apply  1 application. topically 2 (two) times daily. (Patient taking differently: Apply 1 application  topically 2 (two) times daily as needed (for dermatitis).) 60 g 3   No current facility-administered medications for this visit.   Psychiatric Specialty Exam: Review of Systems  Respiratory:  Negative for  shortness of breath.   Cardiovascular:  Negative for chest pain.  Gastrointestinal:  Negative for abdominal pain, constipation and diarrhea.  Neurological:  Negative for dizziness and light-headedness.    There were no vitals taken for this visit.There is no height or weight on file to calculate BMI.  General Appearance: Casual  Eye Contact:  Good  Speech:  Clear and Coherent  Volume:  Normal  Mood:  Dysphoric  Affect:  Congruent  Thought Process:  Coherent  Orientation:  Full (Time, Place, and Person)  Thought Content: Rumination   Suicidal Thoughts:  No  Homicidal Thoughts:  No  Memory:  Immediate;   Good Recent;   Good  Judgement:  Poor  Insight:  Lacking  Psychomotor Activity:  Normal  Concentration:  Concentration: Fair  Recall:  NA  Fund of Knowledge: Good  Language: Good  Akathisia:  NA  Handed:    AIMS (if indicated): not done  Assets:  Communication Skills Desire for Improvement Housing Intimacy Leisure Time Resilience Social Support  ADL's:  Intact  Cognition: WNL  Sleep:  Fair   Screenings: AIMS    Flowsheet Row [facility]  AIMS Total Score 0      AUDIT    Flowsheet Row [facility]) [facility] Ad[facility] ([facility]  Alcohol Use Disorder Identification Test Final Score (AUDIT) 0 0      GAD-7    Flowsheet Row Office Visit from 07/22/2021 in [place] [facility] Office Visit from 07/17/2021 in [facility] Office Visit from 05/27/2021 in [place] [facility] Counselor from 05/06/2021 in [facility] Office Visit from 04/29/2021 in [place] [facility]  Total GAD-7 Score 11 13 2 17 5       PHQ2-9    Flowsheet Row ED from 01/13/2022 in [facility] Video Visit from 08/29/2021 in [facility] Office Visit from 07/22/2021 in [place] [facility] Office Visit from 07/17/2021 in [facility] Counselor from 05/28/2021 in [facility]  PHQ-2 Total Score 6 3 3 3 1   PHQ-9 Total Score 24 17 13 17 14       Flowsheet Row ED from 08/23/2022 in [facility] ED from 06/14/2022 in [facility] Department at [facility] ED from 06/11/2022 in [facility] Emergency Department at [facility]  C-SSRS RISK CATEGORY Moderate Risk No Risk No Risk        Assessment and Plan:   Patient continues to endorse mood lability and behavioral concerns that has been helped some with pristiq, but still concerned about the behavioral concerns. No depression and anxiety at this time so will not make medication changes.  Believe this will be better addressed in DBT, which patient is current participating in. Patient is to bring these behavioral concerns and sxs to her therapist.  No medication side effects thus far.  Patient with left over trazodone, was asked to discard all to remove items that can be used to ovderdose. She denied using these for OD.   Qtc: 421 on 05/2022  Borderline personality disorder Gender Dysphoria Bipolar d/o, current episode depressed PTSD (stable) -  Continue Abilify Maintenna 400mg  q28days - Continue taking Pristiq 100 mg daily  GAD - Continue hydroxyzine 25 mg every 6 hours  as needed - Pristiq 100 mg daily  ADHD - Continue Intuniv 2 mg T24 ER daily    Next appointment in 1 month  Collaboration of Care: Collaboration of Care:   Patient/Guardian was advised Release of Information must be obtained prior to any record release in order to collaborate their care with an outside provider. Patient/Guardian was advised if they have not already done so to contact the registration department to sign all necessary forms in order for [place] to release information regarding their care.   Consent: Patient/Guardian gives verbal consent for treatment and assignment of benefits for services provided during this visit. Patient/Guardian expressed understanding and agreed to proceed.   PGY-3 Princess Bruins, DO 08/28/2022, 2:44 PM

## 2022-09-01 ENCOUNTER — Encounter (HOSPITAL_COMMUNITY): Payer: Self-pay

## 2022-09-01 ENCOUNTER — Ambulatory Visit (INDEPENDENT_AMBULATORY_CARE_PROVIDER_SITE_OTHER): Payer: MEDICAID | Admitting: *Deleted

## 2022-09-01 VITALS — BP 111/71 | HR 67 | Resp 16 | Ht 67.0 in | Wt 184.0 lb

## 2022-09-01 DIAGNOSIS — F3132 Bipolar disorder, current episode depressed, moderate: Secondary | ICD-10-CM | POA: Diagnosis not present

## 2022-09-01 MED ORDER — ARIPIPRAZOLE ER 400 MG IM PRSY
400.0000 mg | PREFILLED_SYRINGE | Freq: Once | INTRAMUSCULAR | Status: AC
Start: 2022-09-01 — End: 2022-09-01
  Administered 2022-09-01: 400 mg via INTRAMUSCULAR

## 2022-09-01 NOTE — Progress Notes (Cosign Needed)
Patient arrived with Abilify Maintena 400mg  from her pharmacy. Given in Left Upper Outer Quadrant without issues or complaints. Ple[employer] smiling. States medication is working well.

## 2022-09-06 ENCOUNTER — Other Ambulatory Visit: Payer: Self-pay | Admitting: Family

## 2022-09-06 DIAGNOSIS — F902 Attention-deficit hyperactivity disorder, combined type: Secondary | ICD-10-CM

## 2022-09-15 ENCOUNTER — Telehealth (HOSPITAL_COMMUNITY): Payer: Self-pay | Admitting: *Deleted

## 2022-09-15 NOTE — Telephone Encounter (Signed)
Patient called asking for his provider to call him regarding why he was taken off Focalin. States he can't focus or get anything done. Message sent to MD for review.

## 2022-09-17 NOTE — Telephone Encounter (Signed)
Spoke to patient that she will need to wait until her next appointment to discuss medications.

## 2022-09-29 ENCOUNTER — Ambulatory Visit (HOSPITAL_COMMUNITY): Payer: MEDICAID

## 2022-10-06 ENCOUNTER — Ambulatory Visit (INDEPENDENT_AMBULATORY_CARE_PROVIDER_SITE_OTHER): Payer: Medicaid Other | Admitting: Student

## 2022-10-06 ENCOUNTER — Encounter (HOSPITAL_COMMUNITY): Payer: Self-pay | Admitting: Student

## 2022-10-06 VITALS — BP 112/69 | HR 61 | Ht 65.35 in | Wt 186.0 lb

## 2022-10-06 DIAGNOSIS — F332 Major depressive disorder, recurrent severe without psychotic features: Secondary | ICD-10-CM | POA: Diagnosis not present

## 2022-10-06 DIAGNOSIS — F4310 Post-traumatic stress disorder, unspecified: Secondary | ICD-10-CM

## 2022-10-06 DIAGNOSIS — F411 Generalized anxiety disorder: Secondary | ICD-10-CM | POA: Diagnosis not present

## 2022-10-06 DIAGNOSIS — Z9151 Personal history of suicidal behavior: Secondary | ICD-10-CM

## 2022-10-06 DIAGNOSIS — F603 Borderline personality disorder: Secondary | ICD-10-CM

## 2022-10-06 DIAGNOSIS — F649 Gender identity disorder, unspecified: Secondary | ICD-10-CM | POA: Diagnosis not present

## 2022-10-06 DIAGNOSIS — F902 Attention-deficit hyperactivity disorder, combined type: Secondary | ICD-10-CM

## 2022-10-06 DIAGNOSIS — Z8659 Personal history of other mental and behavioral disorders: Secondary | ICD-10-CM

## 2022-10-06 HISTORY — DX: Personal history of other mental and behavioral disorders: Z86.59

## 2022-10-06 MED ORDER — GUANFACINE HCL ER 2 MG PO TB24: 2.0000 mg | ORAL_TABLET | Freq: Every day | ORAL | 3 refills | Status: DC

## 2022-10-06 MED ORDER — BUPROPION HCL ER (XL) 150 MG PO TB24
150.0000 mg | ORAL_TABLET | Freq: Every day | ORAL | 1 refills | Status: DC
Start: 2022-10-06 — End: 2022-11-03

## 2022-10-06 MED ORDER — DESVENLAFAXINE SUCCINATE ER 100 MG PO TB24
100.0000 mg | ORAL_TABLET | Freq: Every day | ORAL | 0 refills | Status: DC
Start: 2022-10-06 — End: 2022-11-03

## 2022-10-06 NOTE — Progress Notes (Signed)
BH MD Outpatient Progress Note  10/06/2022 1:31 PM Scott [name]  MRN: [id number]  Assessment:  Scott [name] presents for follow-up evaluation in-person. Today, 10/06/22, patient reports having issues with concentration since coming off of focalin months ago  Identifying Information: Scott [name] is a 22 y.o. adult transgender male to male (she/her) with a past psychiatric history significant for borderline personality, gender dysphoria, PTSD, major depressive disorder, generalized anxiety disorder, H/O ADHD, suicide attempts, inpatient psych admission, who is an established patient with [facility] for management of mood.   Risk Assessment: An assessment of suicide and violence risk factors was performed as part of this evaluation and is not significantly changed from the last visit.             While future psychiatric events cannot be accurately predicted, the patient does not currently require acute inpatient psychiatric care and does not currently meet [facility] involuntary commitment criteria.          Plan:  # Borderline personality  MDD  GAD  PTSD Past medication trials:  Status of problem:  Currently doing well, denied mood concerns (depression/anxiety) at this time. Has not needed vistaril PRN, has nearly full 30day still. 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 Interpersonal relationships are going well currently. Sleep and appetite intact and appropriate.  Interventions: Continued home Pristiq 100 mg daily Continued home hydroxyzine 25 mg every 6 hours as needed  # Personal H/O ADHD Past medication trials:  Status of problem:  Aside from guanfacine, has never tried nonstimulant.  She was started on stimulants from her pediatrician, does not appear to have formal neuropsych testing. She was discontinued from focalin in 07/2022 due to worsening impulsivity and SI, that was suspected to be exacerbated by the stimulant. Has  been doing well so far. However he was dx with ADHD in childhood per patient and that sxs are affecting her job, will trial non-stimulating rx. Will continue to evaluate for ADHD while waiting for formal neuropsych testing, will likely need multiple encounters.  She is aware that in the future if she were on a stimulant, she would need to abstain from any substances, cannabis included, there will be random UDS and if positive, will have to hold stimulant.  Interventions: Continued home guanfacine 2 mg daily STARTED Wellbutrin 150 mg daily Referral to [place] [facility] [place] [facility] for ADHD testing  # Cannabis use d/o Past medication trials:  Status of problem:  Last time smoked was ~47mo ago Interventions: Encouraged cessation  Health Maintenance PCP: Morrell Riddle, PA-C  - transition - IM estrogen monthly, PO progesterone daily - cardiologist - metoprolol er 12.5 mg daily  Return to care in: Future Appointments  Date Time Provider Department Center  10/08/2022 10:30 AM [facility] NURSE [facility] None  10/27/2022  9:30 AM [facility] NURSE [facility] None  11/05/2022 11:00 AM Princess Bruins, DO [facility] None  11/24/2022  9:30 AM [facility] NURSE [facility] None  11/24/2022 10:00 AM [facility] PSY ASSOC-PROVIDER [facility] None    Patient was given contact information for behavioral health clinic and was instructed to call 911 for emergencies.    Patient and plan of care will be discussed with the Attending MD, Dr. Josephina Shih, who agrees with the above statement and plan.   Subjective:  Chief Complaint:  Chief Complaint  Patient presents with   concentration   Interval History:   Discussed results from 08/23/2022 - a1c and tsh wnl,  UDS negative. EKG QTc 427, NSR. Dispense record-Abilify Maintena ER, desvenlafaxine 100 mg, estradiol, progesterone, metoprolol succ ER 12.5 mg daily  Mood: "Really good". Denied depression and anxiety. Stated that things are going well with her  girlfriend. No family conflicts at this time. 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 She is engaging and enjoying in her hobbies, creating music.   Main issues today is issues with focus, Dr. Morrie Sheldon stopped her focalin. 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 Reported that stimulant would be helpful for her job.  In the past she has tried Vyvanse, adderall, intuniv, dianevell. Stated that she was dx at 22yo. Denied use of non-stimulants in the past aside from guanfacine.   Impulsive - guanfacine helps with the impulsiveness. Urge to go back on weed, but has not, last time was ~57mo ago.  Sleep: Good. No issues staying or falling asleep. Energy level is good during the day  Appetite: Good, stable and appropriate  EtOH: Jun 21, 2022 was last time - 1 cup of irish cream Nicotine: Denied - none since 22yo Cannabis: Denied currently, last time over a month ago Other substances: Denied  Patient amenable to starting wellbutrin after discussing the risks, benefits, and side effects. She denied h/o seizures, head trauma, denied fasting/restricting/purging behavior Otherwise patient had no other questions or concerns and was amenable to plan per above.  Safety: Denied active and passive SI, HI, AVH, paranoia.  Patient is aware of [facility], 988 and 911 as well. Denied access to guns or weapons.  Review of Systems  Constitutional:  Negative for fatigue.  Respiratory:  Negative for shortness of breath.   Cardiovascular:  Negative for chest pain.  Gastrointestinal:  Negative for abdominal pain, constipation and diarrhea.  Musculoskeletal:  Negative for myalgias.  Neurological:  Negative for dizziness and light-headedness.    Visit Diagnosis:    ICD-10-CM   1. Borderline personality disorder (HCC)  F60.3 desvenlafaxine (PRISTIQ) 100 MG 24 hr tablet    guanFACINE (INTUNIV) 2 MG TB24 ER tablet    buPROPion  (WELLBUTRIN XL) 150 MG 24 hr tablet    2. Gender dysphoria  F64.9     3. MDD (major depressive disorder), recurrent severe, without psychosis (HCC)  F33.2 desvenlafaxine (PRISTIQ) 100 MG 24 hr tablet    4. Generalized anxiety disorder  F41.1 desvenlafaxine (PRISTIQ) 100 MG 24 hr tablet    5. PTSD (post-traumatic stress disorder)  F43.10 desvenlafaxine (PRISTIQ) 100 MG 24 hr tablet    6. History of admission to inpatient psychiatry department  Z86.59     7. Personal H/O ADHD (attention deficit hyperactivity disorder), combined type  F90.2 guanFACINE (INTUNIV) 2 MG TB24 ER tablet    Ambulatory referral to Psychology    buPROPion (WELLBUTRIN XL) 150 MG 24 hr tablet    8. History of suicide attempt  Z91.51       Past Psychiatric History:  Diagnoses: borderline personality d/o (10/07/2021), MDD, GAD, personal H/O ADHD, PTSD, cannabis use d/o Medication trials:  Current: pristiq 100 mg, guanfacine 2 mg daily, abilify maintenna, PRN vistaril and prn trazodone -  to good effect Previous: Vyvanse, adderall, intuniv, dianevell, focalin (focalin d/c'd 07/2022 for concern of worsening impulsivity leading to SI) Previous psychiatrist/therapist: Yes - DBT Hospitalizations: Yes, multiple ED/Urgent Care: Yes, multiple Suicide attempts: Yes, multiple SIB: Yes Hx of violence towards others: Denied Current access to guns: Denied  Substance Use History: EtOH:  reports current alcohol use of about 1.0 standard drink of alcohol per week. Nicotine:  reports that she has quit smoking. Her smoking use included cigarettes. She has been exposed to tobacco smoke. She has never used smokeless tobacco. 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  Head trauma: Denied Seizures: Denied Allergies: Patient has no known allergies.   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 Marital Status: Single Children: NA Support: Friends, family  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   History of admission to inpatient psychiatry department 10/06/2022   History of suicide attempt 01/27/2021    Past Surgical History:  Procedure Laterality Date   ADENOIDECTOMY     EYE MUSCLE SURGERY Bilateral    x3   MYRINGOTOMY WITH TUBE PLACEMENT Bilateral    TONSILLECTOMY AND ADENOIDECTOMY Bilateral 02/03/2013   Procedure: TONSILLECTOMY AND ADENOIDECTOMY;  Surgeon: Osborn Coho, MD;  Location: [facility];  Service: ENT;  Laterality: Bilateral;   Family History:  Family History  Problem Relation Age of Onset   Mental illness Mother    Bipolar disorder Mother    Personality disorder Mother    Alcohol abuse Mother    Drug abuse Mother    Skin cancer Maternal Grandmother    Mental illness Paternal Grandmother    Hepatitis C Paternal Grandfather    Cirrhosis Paternal Grandfather    Heart attack Paternal Grandfather    Social History   Socioeconomic History   Marital status: Single    Spouse name: Not on file   Number of children: Not on file   Years of education: Not on file   Highest  education level: High school graduate  Occupational History   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    Comment: works at [employer]  Tobacco Use   Smoking status: Former    Types: Cigarettes    Passive exposure: Yes   Smokeless tobacco: Never   Tobacco comments:    Dad vapes in house and car - maybe with nicotine    No nicotine use since 22yo  Vaping Use   Vaping status: Every Day   Substances: CBD  Substance and Sexual Activity   Alcohol use: Yes    Alcohol/week: 1.0 standard drink of alcohol    Types: 1 Shots of liquor per week    Comment: every few months, socially   Drug use: Not Currently    Types: Marijuana    Comment: last used 08/2022   Sexual activity: Not on file  Other Topics Concern   Not on file  Social History Narrative   Lives with dad, and sister.    He has graduated from high school   Recently quit working at [employer]  Social Determinants of Health   Financial Resource Strain: Low Risk  (03/06/2022)   Received from [facility], [facility]   Overall Financial Resource Strain (CARDIA)    Difficulty of Paying Living Expenses: Not hard at all  Recent Concern: Financial Resource Strain - Medium Risk (12/30/2021)   [facility] from [facility], [facility]   Overall Financial Resource Strain (CARDIA)    Difficulty of Paying Living Expenses: Somewhat hard  Food Insecurity: Patient Declined (07/04/2022)   Received [facility], [facility]   Hunger Vital Sign    Worried About Running Out of Food in the Last Year: Patient declined    Ran Out of Food in the Last Year: Patient declined  Transportation Needs: No Transportation Needs (07/09/2022)   Received from [facility], [facility]   PRAPARE - Transportation    Lack of Transportation (Medical): No    Lack of Transportation (Non-Medical): No  Physical Activity: Unknown (03/06/2022)   Received from [facility], [facility]   Exercise  Vital Sign    Days of Exercise per Week: 0 days    Minutes of Exercise per Session: Not on file  Stress: Patient Declined (07/04/2022)   Received from [facility] - Occupational Stress Questionnaire    Feeling of Stress : Patient declined  Social Connections: Socially Isolated (03/06/2022)   Received from [facility], [facility]   Social Network    How would you rate your social network (family, work, friends)?: Little participation, lonely and socially isolated    Allergies: No Known Allergies  Current Medications: Current Outpatient Medications  Medication Sig Dispense Refill   buPROPion (WELLBUTRIN XL) 150 MG 24 hr tablet Take 1 tablet (150 mg total) by mouth daily. 30 tablet 1   estradiol valerate (DELESTROGEN) 20 MG/ML injection Inject 20 mg into the muscle every 28 (twenty-eight) days.     metoprolol succinate (TOPROL-XL) 25 MG 24 hr tablet Take 12.5 mg by mouth daily.     ARIPiprazole ER (ABILIFY MAINTENA) 400 MG PRSY prefilled syringe Inject 400 mg into the muscle every 28 (twenty-eight) days. Next dose is due 08/01/2022 1 each 11   desvenlafaxine (PRISTIQ) 100 MG 24 hr tablet Take 1 tablet (100 mg total) by mouth daily. 60 tablet 0   guanFACINE (INTUNIV) 2 MG TB24 ER tablet Take 1 tablet (2 mg total) by mouth daily. 30 tablet 3   hydrOXYzine (ATARAX) 25 MG tablet Take 1 tablet (25 mg total) by mouth every 6 (six) hours as needed for anxiety. 90 tablet 1   progesterone (PROMETRIUM) 100 MG capsule Take 1 capsule (100 mg total) by mouth daily. 30 capsule 3   triamcinolone ointment (KENALOG) 0.5 % Apply 1 application. topically 2 (two) times daily. (Patient taking differently: Apply 1 application  topically 2 (two) times daily as needed (for dermatitis).) 60 g 3   No current facility-administered medications for this visit.    Objective: Psychiatric Specialty Exam: Blood pressure 112/69, pulse 61, height 5' 5.35"  (1.66 m), weight 186 lb (84.4 kg), SpO2 98%.Body mass index is 30.62 kg/m.  General Appearance: Casual, faily groomed  Eye Contact:  Good    Speech:  Clear, coherent, normal rate   Volume:  Normal   Mood:  "really good"  Affect:  Appropriate, congruent, full range  Thought Content: Logical, rumination  Suicidal Thoughts: Denied active and passive SI    Thought Process:  Coherent, goal-directed, linear  Orientation:  A&Ox4   Memory:  Immediate good  Judgment:  Fair   Insight:  Shallow  Concentration:  Attention and concentration good   Recall:  Good  Fund of Knowledge: Good  Language: Good, fluent  Psychomotor Activity: Normal  Akathisia:  NA   AIMS (if indicated): NA   Assets:  Communication Skills Desire for Improvement Financial Resources/Insurance Housing Intimacy Leisure Time Physical Health Social Support Talents/Skills Transportation  ADL's:  Intact  Cognition: WNL  Sleep:  Good     Physical Exam Vitals and nursing note reviewed.  Constitutional:      General: She is not in acute distress.    Appearance: She is not ill-appearing, toxic-appearing or diaphoretic.  HENT:     Head: Normocephalic.  Pulmonary:     Effort: Pulmonary effort is normal. No respiratory distress.  Neurological:     General: No focal deficit present.     Mental Status: She is alert and oriented to person, place, and time.     Metabolic Disorder Labs: Lab Results  Component Value Date   HGBA1C 5.1 08/23/2022   MPG 100 08/23/2022   MPG 105 01/13/2022   Lab Results  Component Value Date   PROLACTIN 6.4 07/09/2020   PROLACTIN 22.4 (H) 04/02/2020   Lab Results  Component Value Date   CHOL 167 08/23/2022   TRIG 171 (H) 08/23/2022   HDL 31 (L) 08/23/2022   CHOLHDL 5.4 08/23/2022   VLDL 34 08/23/2022   LDLCALC 102 (H) 08/23/2022   LDLCALC 143 (H) 01/13/2022   Lab Results  Component Value Date   TSH 3.335 08/23/2022   TSH 5.194 (H) 01/23/2022    Therapeutic Level  Labs: No results found for: "LITHIUM" No results found for: "VALPROATE" No results found for: "CBMZ"  Screenings: AIMS    Flowsheet Row [facility]  AIMS Total Score 0      AUDIT    Flowsheet Row [facility]) [facility] Admission (Discharged[facility]  Alcohol Use Disorder Identification Test Final Score (AUDIT) 0 0      GAD-7    Flowsheet Row Office Visit from 07/22/2021 in [place] [facility] Office Visit from 07/17/2021 in [facility] Office Visit from 05/27/2021 in [facility] Counselor from 05/06/2021 in [facility] Office Visit from 04/29/2021 in [facility]  Total GAD-7 Score 11 13 2 17 5       PHQ2-9    Flowsheet Row ED from 01/13/2022 in [facility] Video Visit from 08/29/2021 in [facility] Office Visit from 07/22/2021 in [facility] Office Visit from 07/17/2021 in [facility] Counselor from 05/28/2021 in [facility]  PHQ-2 Total Score 6 3 3 3 1   PHQ-9 Total Score 24 17 13 17 14       Flowsheet Row ED from 08/23/2022 in [facility] ED from 06/14/2022 in [facility] Emergency Department at [facility] ED from 06/11/2022 in [facility] Emergency Department at [facility]  C-SSRS RISK CATEGORY Moderate Risk No Risk No Risk       Patient/Guardian was advised Release of  Information must be obtained prior to any record release in order to collaborate their care with an outside provider.  Patient/Guardian was advised if they have not already done so to contact the registration department to sign all necessary forms in order for [place] to release information regarding their care.   Consent: Patient/Guardian gives verbal consent for treatment and assignment of benefits for services provided during this visit. Patient/Guardian expressed understanding and agreed to proceed.   Princess Bruins, DO Psych Resident, PGY-3

## 2022-10-08 ENCOUNTER — Encounter (HOSPITAL_COMMUNITY): Payer: Self-pay

## 2022-10-08 ENCOUNTER — Ambulatory Visit (INDEPENDENT_AMBULATORY_CARE_PROVIDER_SITE_OTHER): Payer: MEDICAID

## 2022-10-08 VITALS — BP 113/74 | HR 73 | Ht 65.0 in | Wt 189.4 lb

## 2022-10-08 DIAGNOSIS — G4700 Insomnia, unspecified: Secondary | ICD-10-CM

## 2022-10-08 DIAGNOSIS — F200 Paranoid schizophrenia: Secondary | ICD-10-CM

## 2022-10-08 DIAGNOSIS — F411 Generalized anxiety disorder: Secondary | ICD-10-CM

## 2022-10-08 MED ORDER — ARIPIPRAZOLE ER 400 MG IM PRSY
400.0000 mg | PREFILLED_SYRINGE | Freq: Once | INTRAMUSCULAR | Status: AC
  Administered 2022-10-08: 400 mg via INTRAMUSCULAR

## 2022-10-08 NOTE — Progress Notes (Cosign Needed Addendum)
PATIENT PRESENTS TO THE OFFICE FOR ABILIFY MAINTENNA 400 MG INJECTION GIVEN BY Cecil Bixby , PT TOLERATED INJECTION WELL IN RIGHT UPPER OUTAR GlUTE AND WILL RETURN IN 28 DAYS

## 2022-10-20 ENCOUNTER — Encounter (HOSPITAL_COMMUNITY): Payer: Self-pay

## 2022-10-20 ENCOUNTER — Encounter (HOSPITAL_COMMUNITY): Payer: Medicaid Other | Admitting: Student

## 2022-10-27 ENCOUNTER — Ambulatory Visit (HOSPITAL_COMMUNITY): Payer: MEDICAID

## 2022-10-30 ENCOUNTER — Telehealth (HOSPITAL_COMMUNITY): Payer: Self-pay | Admitting: Student

## 2022-10-30 DIAGNOSIS — F411 Generalized anxiety disorder: Secondary | ICD-10-CM

## 2022-10-30 DIAGNOSIS — F902 Attention-deficit hyperactivity disorder, combined type: Secondary | ICD-10-CM

## 2022-10-30 DIAGNOSIS — F603 Borderline personality disorder: Secondary | ICD-10-CM

## 2022-10-30 DIAGNOSIS — F332 Major depressive disorder, recurrent severe without psychotic features: Secondary | ICD-10-CM

## 2022-10-30 DIAGNOSIS — F4310 Post-traumatic stress disorder, unspecified: Secondary | ICD-10-CM

## 2022-11-03 MED ORDER — GUANFACINE HCL ER 2 MG PO TB24
2.0000 mg | ORAL_TABLET | Freq: Every day | ORAL | 1 refills | Status: DC
Start: 2022-11-03 — End: 2022-11-05

## 2022-11-03 MED ORDER — DESVENLAFAXINE SUCCINATE ER 100 MG PO TB24
100.0000 mg | ORAL_TABLET | Freq: Every day | ORAL | 0 refills | Status: DC
Start: 2022-11-03 — End: 2022-12-03

## 2022-11-03 MED ORDER — HYDROXYZINE HCL 25 MG PO TABS
25.0000 mg | ORAL_TABLET | Freq: Four times a day (QID) | ORAL | 1 refills | Status: DC | PRN
Start: 2022-11-03 — End: 2022-12-03

## 2022-11-03 NOTE — Telephone Encounter (Signed)
Returned call to [place] at [phone]  Stated that when patient started wellbutrin noted that his depression spiked and that she stopped the medications.  Also requested refills on her meds, pristique, vistaril, and guanfacine since she is out. To be sent to a [facility] [place].

## 2022-11-05 ENCOUNTER — Ambulatory Visit (INDEPENDENT_AMBULATORY_CARE_PROVIDER_SITE_OTHER): Payer: MEDICAID

## 2022-11-05 ENCOUNTER — Encounter (HOSPITAL_COMMUNITY): Payer: Self-pay | Admitting: Student

## 2022-11-05 ENCOUNTER — Ambulatory Visit (HOSPITAL_COMMUNITY): Payer: MEDICAID | Admitting: Student

## 2022-11-05 ENCOUNTER — Encounter (HOSPITAL_COMMUNITY): Payer: Self-pay

## 2022-11-05 VITALS — BP 109/68 | HR 94 | Wt 184.6 lb

## 2022-11-05 DIAGNOSIS — F200 Paranoid schizophrenia: Secondary | ICD-10-CM

## 2022-11-05 DIAGNOSIS — F411 Generalized anxiety disorder: Secondary | ICD-10-CM

## 2022-11-05 DIAGNOSIS — F902 Attention-deficit hyperactivity disorder, combined type: Secondary | ICD-10-CM

## 2022-11-05 DIAGNOSIS — F332 Major depressive disorder, recurrent severe without psychotic features: Secondary | ICD-10-CM | POA: Diagnosis not present

## 2022-11-05 DIAGNOSIS — F4310 Post-traumatic stress disorder, unspecified: Secondary | ICD-10-CM | POA: Diagnosis not present

## 2022-11-05 DIAGNOSIS — F603 Borderline personality disorder: Secondary | ICD-10-CM | POA: Diagnosis not present

## 2022-11-05 DIAGNOSIS — G4700 Insomnia, unspecified: Secondary | ICD-10-CM

## 2022-11-05 MED ORDER — DESVENLAFAXINE SUCCINATE ER 50 MG PO TB24
ORAL_TABLET | ORAL | 0 refills | Status: DC
Start: 2022-11-05 — End: 2022-12-30

## 2022-11-05 MED ORDER — HYDROXYZINE HCL 25 MG PO TABS
25.0000 mg | ORAL_TABLET | Freq: Every day | ORAL | 2 refills | Status: DC | PRN
Start: 2022-11-05 — End: 2022-12-30

## 2022-11-05 MED ORDER — GUANFACINE HCL ER 2 MG PO TB24
2.0000 mg | ORAL_TABLET | Freq: Every day | ORAL | 2 refills | Status: DC
Start: 2022-11-05 — End: 2022-12-29

## 2022-11-05 MED ORDER — ARIPIPRAZOLE ER 400 MG IM PRSY
400.0000 mg | PREFILLED_SYRINGE | Freq: Once | INTRAMUSCULAR | Status: AC
  Administered 2022-11-05: 400 mg via INTRAMUSCULAR

## 2022-11-05 NOTE — Progress Notes (Signed)
BH MD Outpatient Progress Note  11/05/2022 5:27 PM Scott [name]  MRN: [id number]  Assessment:  Scott [name] presents for follow-up evaluation in-person.   Identifying Information: Scott [name] is a 22 y.o. adult transgender male to male (she/her) with a past psychiatric history significant for borderline personality, gender dysphoria, PTSD, major depressive disorder, generalized anxiety disorder, H/O ADHD, suicide attempts, inpatient psych admission, who is an established patient with [facility] for management of mood.   Risk Assessment: An assessment of suicide and violence risk factors was performed as part of this evaluation and is not significantly changed from the last visit.             While future psychiatric events cannot be accurately predicted, the patient does not currently require acute inpatient psychiatric care and does not currently meet [facility] involuntary commitment criteria.          Plan:  # Borderline personality  MDD  GAD  PTSD Past medication trials:  Status of problem:  Had been getting partial effectiveness with pristiq. Patient ran out of rx, so need to re-titrate pristiq before considering further augmentation. Interventions: Therapy: [facility] (11/05/2022) RE-STARTED home Pristiq 50 mg daily x7-14d, then INREASED to 100 mg daily Continued home hydroxyzine 25 mg every 6 hours as needed Continued LAI Abilify maintena 400 mg monthly (11/05/2022)  # Personal H/O ADHD Past medication trials: adderall, vyvanse, focalin, wellbutrin Status of problem:  No formal neuropsych testing. Was on stimulants in the past, but was d/c'd due to worsening impulsivity and SI, that would lead to inpatient psych admission. Attempted wellbutrin, however she had recurrence of similar sxs although not as bad when she was on a stimulant. For that reason, will avoid stimulants for her if neuropsych testing is positive for  ADHD.  She is aware that in the future if she were on a stimulant, she would need to abstain from any substances, cannabis included, there will be random UDS and if positive, will have to hold stimulant.  Interventions: Continued home guanfacine 2 mg daily DISCONTINUED Wellbutrin 150 mg daily Referral to [place] [facility] for ADHD testing  # Cannabis use d/o Past medication trials:  Status of problem:  Last time smoked was ~27mo ago Interventions: Encouraged cessation  Health Maintenance PCP: Morrell Riddle, PA-C  - transition - IM estrogen monthly, PO progesterone daily - cardiologist - metoprolol er 12.5 mg daily  Return to care in: Future Appointments  Date Time Provider Department Center  12/03/2022 11:00 AM [facility] NURSE [facility] None  12/03/2022 11:30 AM [facility] PSY ASSOC-PROVIDER [facility] None  12/22/2022 10:30 AM Princess Bruins, DO [facility] None   Patient was given contact information for behavioral health clinic and was instructed to call 911 for emergencies.    Patient and plan of care will be discussed with the Attending MD, Dr. Josephina Shih, who agrees with the above statement and plan.   Subjective:  Chief Complaint:  Chief Complaint  Patient presents with   Anxiety   Stress   Depression   Trauma   Interval History:   Did not like the wellbutrin and stopped it after 2 weeks. Stated that she felt more impulsive while she was on it.  Also that she is moving to a different home with girlfriend and was not able to get her meds, so she had been out of all of her meds for the past week.   Mood: stated that depression  had been worsening over the past 2 months, stated that she feels hopeless, unable to identify any triggers or changes in meds. Really started to notice worsening mood, reactivity, and tearfulness over the past week.   Sleep: Good. No issues staying or falling asleep. Energy level is good during the day  Appetite: Good, stable  and appropriate  EtOH: Denied Nicotine: Denied Cannabis: Denied  Other substances: Denied  Patient amenable to plan above after discussing the risks, benefits, and side effects.  Safety: Denied active and passive SI, HI, AVH, paranoia.  Patient is aware of [facility], 988 and 911 as well. Denied access to guns or weapons.  Review of Systems  Constitutional:  Negative for fatigue.  Respiratory:  Negative for shortness of breath.   Cardiovascular:  Negative for chest pain.  Gastrointestinal:  Negative for abdominal pain, constipation and diarrhea.  Musculoskeletal:  Negative for myalgias.  Neurological:  Negative for dizziness and light-headedness.    Visit Diagnosis:    ICD-10-CM   1. MDD (major depressive disorder), recurrent severe, without psychosis (HCC)  F33.2 desvenlafaxine (PRISTIQ) 50 MG 24 hr tablet    2. Generalized anxiety disorder  F41.1 desvenlafaxine (PRISTIQ) 50 MG 24 hr tablet    hydrOXYzine (ATARAX) 25 MG tablet    3. PTSD (post-traumatic stress disorder)  F43.10 desvenlafaxine (PRISTIQ) 50 MG 24 hr tablet    4. Borderline personality disorder (HCC)  F60.3 desvenlafaxine (PRISTIQ) 50 MG 24 hr tablet    guanFACINE (INTUNIV) 2 MG TB24 ER tablet    5. Personal H/O ADHD (attention deficit hyperactivity disorder), combined type  F90.2 guanFACINE (INTUNIV) 2 MG TB24 ER tablet       Past Psychiatric History:  Diagnoses: borderline personality d/o (10/07/2021), MDD, GAD, personal H/O ADHD, PTSD, cannabis use d/o Medication trials:  Current: zoloft > prozac > pristiq 100 mg, guanfacine 2 mg daily, abilify maintenna, PRN vistaril and prn trazodone -  to good effect Previous: Vyvanse (impulsive, SI went to inpatient), adderall, intuniv, dianevell, focalin (focalin d/c'd 07/2022 for concern of worsening impulsivity leading to SI) Previous psychiatrist/therapist: Yes - DBT for 3 yrs (she found it unhelpful) Hospitalizations: Yes, multiple ED/Urgent Care: Yes, multiple Suicide  attempts: Yes, multiple SIB: Yes Hx of violence towards others: Denied Current access to guns: Denied  Substance Use History: EtOH:  reports current alcohol use of about 1.0 standard drink of alcohol per week. Nicotine:  reports that she has quit smoking. Her smoking use included cigarettes. She has been exposed to tobacco smoke. She has never used smokeless tobacco. 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  Head trauma: Denied Seizures: Denied Allergies: Patient has no known allergies.   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 Marital Status: Single Children: NA Support: Friends, family  Past Medical History:  Past Medical History:  Diagnosis Date   ADHD (attention deficit hyperactivity disorder)    ADHD (attention deficit hyperactivity disorder), combined type 06/27/2015   Allergy    Phreesia 08/22/2019  Anxiety    Phreesia 08/22/2019   Depression    Phreesia 08/22/2019   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   History of admission to inpatient psychiatry department 10/06/2022   History of suicide attempt 01/27/2021    Past Surgical History:  Procedure Laterality Date   ADENOIDECTOMY     EYE MUSCLE SURGERY Bilateral    x3   MYRINGOTOMY WITH TUBE PLACEMENT Bilateral    TONSILLECTOMY AND ADENOIDECTOMY Bilateral 02/03/2013    Procedure: TONSILLECTOMY AND ADENOIDECTOMY;  Surgeon: Osborn Coho, MD;  Location: [facility];  Service: ENT;  Laterality: Bilateral;   Family History:  Family History  Problem Relation Age of Onset   Mental illness Mother    Bipolar disorder Mother    Personality disorder Mother    Alcohol abuse Mother    Drug abuse Mother    Skin cancer Maternal Grandmother    Mental illness Paternal Grandmother    Hepatitis C Paternal Grandfather    Cirrhosis Paternal Grandfather    Heart attack Paternal Grandfather    Social History   Socioeconomic History   Marital status: Single    Spouse name: Not on file   Number of children: Not on file   Years of education: Not on file   Highest education level: High school graduate  Occupational History   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    Comment: works at [employer]  Tobacco Use   Smoking status: Former    Types: Cigarettes    Passive exposure: Yes   Smokeless tobacco: Never   Tobacco comments:    Dad vapes in house and car - maybe with nicotine    No nicotine use since 22yo  Vaping Use   Vaping status: Every Day   Substances: CBD  Substance and Sexual Activity   Alcohol use: Yes    Alcohol/week: 1.0 standard drink of alcohol    Types: 1 Shots of liquor per week    Comment: every few months, socially   Drug use: Not Currently    Types: Marijuana    Comment: last used 08/2022   Sexual activity: Not on file  Other Topics Concern   Not on file  Social History Narrative   Lives with dad, and sister.    He has graduated from high school   Recently quit working at [employer]: Low Risk  (03/06/2022)   Received from [facility], [facility]   Overall Financial Resource Strain (CARDIA)    Difficulty of Paying Living Expenses: Not hard at all  Recent Concern: Financial Resource Strain - Medium Risk (12/30/2021)   Received [facility], [facility]   Overall Financial Resource Strain (CARDIA)    Difficulty of Paying Living Expenses: Somewhat hard  Food Insecurity: Patient Declined (07/04/2022)   Received [facility], [facility]   Hunger Vital Sign    Worried About Running Out of Food in the Last Year: Patient declined    Ran Out of Food in the Last Year: Patient declined  Transportation Needs: No Transportation Needs (07/09/2022)   Received from [facility], [facility]   PRAPARE - Transportation    Lack of Transportation (Medical): No    Lack of Transportation (Non-Medical): No  Physical Activity: Unknown (03/06/2022)   Received from [facility], [facility]   Exercise Vital Sign    Days of Exercise per Week: 0 days    Minutes of  Exercise per Session: Not on file  Stress: Patient Declined (07/04/2022)   Received from [facility], [facility] - Occupational Stress Questionnaire    Feeling of Stress : Patient declined  Social Connections: Socially Isolated (03/06/2022)   Received from [facility], [facility]   Social Network    How would you rate your social network (family, work, friends)?: Little participation, lonely and socially isolated    Allergies: No Known Allergies  Current Medications: Current Outpatient Medications  Medication Sig Dispense Refill   ARIPiprazole ER (ABILIFY MAINTENA) 400 MG PRSY prefilled syringe Inject 400 mg into the muscle every 28 (twenty-eight) days. Next dose is due 08/01/2022 1 each 11   desvenlafaxine (PRISTIQ) 50 MG 24 hr tablet Take 1 tablet (50 mg total) by mouth every morning for 14 days, THEN 2 tablets (100 mg total) every morning. 134 tablet 0   estradiol valerate (DELESTROGEN) 20 MG/ML injection Inject 20 mg into the muscle every 28 (twenty-eight) days.     guanFACINE (INTUNIV) 2 MG TB24 ER tablet Take 1 tablet (2 mg total) by mouth daily. 30 tablet 2   hydrOXYzine  (ATARAX) 25 MG tablet Take 1 tablet (25 mg total) by mouth daily as needed for anxiety. 30 tablet 2   metoprolol succinate (TOPROL-XL) 25 MG 24 hr tablet Take 12.5 mg by mouth daily.     progesterone (PROMETRIUM) 100 MG capsule Take 1 capsule (100 mg total) by mouth daily. 30 capsule 3   triamcinolone ointment (KENALOG) 0.5 % Apply 1 application. topically 2 (two) times daily. (Patient taking differently: Apply 1 application  topically 2 (two) times daily as needed (for dermatitis).) 60 g 3   No current facility-administered medications for this visit.    Objective: Psychiatric Specialty Exam: There were no vitals taken for this visit.There is no height or weight on file to calculate BMI.  General Appearance: Casual, faily groomed  Eye Contact:  Good    Speech:  Clear, coherent, normal rate   Volume:  Normal   Mood:  "depressed"  Affect:  Appropriate, congruent, full range  Thought Content: Logical, rumination  Suicidal Thoughts: Denied active and passive SI    Thought Process:  Coherent, goal-directed, linear   Orientation:  A&Ox4   Memory:  Immediate good  Judgment:  Fair   Insight:  Shallow  Concentration:  Attention and concentration good   Recall:  Good  Fund of Knowledge: Good  Language: Good, fluent  Psychomotor Activity: Normal  Akathisia:  NA   AIMS (if indicated): NA   Assets:  Communication Skills Desire for Improvement Financial Resources/Insurance Housing Intimacy Leisure Time Physical Health Social Support Talents/Skills Transportation  ADL's:  Intact  Cognition: WNL  Sleep:  Good     Physical Exam Vitals and nursing note reviewed.  Constitutional:      General: She is not in acute distress.    Appearance: She is not ill-appearing, toxic-appearing or diaphoretic.  HENT:     Head: Normocephalic.  Pulmonary:     Effort: Pulmonary effort is normal. No respiratory distress.  Neurological:     General: No focal deficit present.     Mental Status: She  is alert and oriented to person, place, and time.     Metabolic Disorder Labs: Lab Results  Component Value Date   HGBA1C 5.1 08/23/2022   MPG 100 08/23/2022   MPG 105 01/13/2022   Lab Results  Component Value Date   PROLACTIN 6.4 07/09/2020  PROLACTIN 22.4 (H) 04/02/2020   Lab Results  Component Value Date   CHOL 167 08/23/2022   TRIG 171 (H) 08/23/2022   HDL 31 (L) 08/23/2022   CHOLHDL 5.4 08/23/2022   VLDL 34 08/23/2022   LDLCALC 102 (H) 08/23/2022   LDLCALC 143 (H) 01/13/2022   Lab Results  Component Value Date   TSH 3.335 08/23/2022   TSH 5.194 (H) 01/23/2022    Therapeutic Level Labs: No results found for: "LITHIUM" No results found for: "VALPROATE" No results found for: "CBMZ"  Screenings: AIMS    Flowsheet Row [facility]  AIMS Total Score 0      AUDIT    Flowsheet Row [facility]) [facility] Admission (Discharged[facility]  Alcohol Use Disorder Identification Test Final Score (AUDIT) 0 0      GAD-7    Flowsheet Row Office Visit from 07/22/2021 in [place] [facility] Office Visit from 07/17/2021 in [facility] Office Visit from 05/27/2021 in [place] [facility] Counselor from 05/06/2021 in [facility] Office Visit from 04/29/2021 in [place] [facility]  Total GAD-7 Score 11 13 2 17 5       PHQ2-9    Flowsheet Row ED from 01/13/2022 in [facility] Video Visit from 08/29/2021 in [facility] Office Visit from 07/22/2021 in [place] [facility] Office Visit from 07/17/2021 in [facility] Counselor from 05/28/2021 in [facility]  PHQ-2 Total Score 6 3 3 3 1   PHQ-9 Total Score 24 17 13 17 14       Flowsheet Row ED from 08/23/2022 in [facility] ED from 06/14/2022 in [facility] Department at [facility] ED from 06/11/2022 in [facility] Emergency Department at [facility]  C-SSRS RISK CATEGORY Moderate Risk No Risk No Risk       Patient/Guardian was advised Release of Information must be obtained prior to any record release in order to collaborate their care with an outside provider. Patient/Guardian was advised if they have not already done so to contact the registration department to sign all necessary forms in order for [place] to release information regarding their care.   Consent: Patient/Guardian gives verbal consent for treatment and assignment of benefits for services provided during this visit. Patient/Guardian expressed understanding and agreed to proceed.   Princess Bruins, DO Psych Resident, PGY-3

## 2022-11-05 NOTE — Progress Notes (Cosign Needed)
PATIENT PRESENTS TO THE OFFICE FOR ABILIFY MAINTENNA 400 MG INJECTION GIVEN BY Nyliah Nierenberg , PT TOLERATED INJECTION WELL IN LEFT UPPER OUTAR GlUTE AND WILL RETURN IN 28 DAYS

## 2022-11-07 ENCOUNTER — Ambulatory Visit (HOSPITAL_COMMUNITY)
Admission: EM | Admit: 2022-11-07 | Discharge: 2022-11-07 | Disposition: A | Discharge status: Home / Self Care | Payer: MEDICAID | Attending: Nurse Practitioner | Admitting: Nurse Practitioner

## 2022-11-07 DIAGNOSIS — F4310 Post-traumatic stress disorder, unspecified: Secondary | ICD-10-CM | POA: Insufficient documentation

## 2022-11-07 DIAGNOSIS — F411 Generalized anxiety disorder: Secondary | ICD-10-CM | POA: Diagnosis not present

## 2022-11-07 DIAGNOSIS — Z9152 Personal history of nonsuicidal self-harm: Secondary | ICD-10-CM | POA: Insufficient documentation

## 2022-11-07 DIAGNOSIS — F640 Transsexualism: Secondary | ICD-10-CM | POA: Insufficient documentation

## 2022-11-07 DIAGNOSIS — F603 Borderline personality disorder: Secondary | ICD-10-CM | POA: Insufficient documentation

## 2022-11-07 DIAGNOSIS — Z9151 Personal history of suicidal behavior: Secondary | ICD-10-CM | POA: Insufficient documentation

## 2022-11-07 DIAGNOSIS — Z560 Unemployment, unspecified: Secondary | ICD-10-CM | POA: Insufficient documentation

## 2022-11-07 DIAGNOSIS — F902 Attention-deficit hyperactivity disorder, combined type: Secondary | ICD-10-CM | POA: Insufficient documentation

## 2022-11-07 DIAGNOSIS — F339 Major depressive disorder, recurrent, unspecified: Secondary | ICD-10-CM | POA: Insufficient documentation

## 2022-11-07 NOTE — Progress Notes (Signed)
   11/07/22 1920  [facility] Triage Screening (Walk-ins at [facility] only)  How Did You Hear About [place]? Legal System  What Is the Reason for Your Visit/Call Today? Pt [facility] to the [facility] voluntarily, brought in by safe transport. Pt reports complaints of depression and acknowledges symptoms of increased irritability, worthlessness, hopelessness, difficulty concentrating, decreased energy along with poor appetite and poor sleep. Pt denies SI/HI. Pt reports thoughts of Self injurious behaviors via cutting but reports that she did not act on these thoughts because she has not engaged in self-injurious behaviors in a year and did not want to revert to engaging in self-injurious behaviors.  Pt denied AVH and substance/alcohol use. Pt reports receiving Medication Management from [facility] but has not been taking medications within the last month due to problems with insurance and prior authorizations. Pt also reports having an appointment to begin therapy next week with [facility]. Pt has a history of MDD, GAD and Borderline Personality Disorder. Pt reports last hospitalization was in March for SI w/ no plan, intent, method.  How Long Has This Been Causing You Problems? <Week  Have You Recently Had Any Thoughts About Hurting Yourself? Yes  How long ago did you have thoughts about hurting yourself? Reports thoughts to engage in SIB via cutting.  Are You Planning to Commit Suicide/Harm Yourself At This time? No  Have you Recently Had Thoughts About Hurting Someone [name]? No  Are you currently experiencing any auditory, visual or other hallucinations? No  Have You Used Any Alcohol or Drugs in the Past 24 Hours? No  Do you have any current medical co-morbidities that require immediate attention? No  Clinician description of patient physical appearance/behavior: Pt is casually dressed, calm and cooperative.  What Do You Feel Would Help You the Most Today? Treatment for Depression or other mood problem;Medication(s)  If  access to [facility] was not available, would you have sought care in the Emergency Department? No  Determination of Need Urgent (48 hours)  Options For Referral Intensive Outpatient Therapy;Medication Management;BH Urgent Care

## 2022-11-07 NOTE — Discharge Instructions (Signed)
Discharge recommendations:  Patient is to take medications as prescribed. Please see information for follow-up appointment with psychiatry and therapy. Please follow up with your primary care provider for all medical related needs.   Therapy: We recommend that patient participate in individual therapy to address mental health concerns.  Medications: The patient or guardian is to contact a medical professional and/or outpatient provider to address any new side effects that develop. The patient or guardian should update outpatient providers of any new medications and/or medication changes.   Atypical antipsychotics: If you are prescribed an atypical antipsychotic, it is recommended that your height, weight, BMI, blood pressure, fasting lipid panel, and fasting blood sugar be monitored by your outpatient providers.  Safety:  The patient should abstain from use of illicit substances/drugs and abuse of any medications. If symptoms worsen or do not continue to improve or if the patient becomes actively suicidal or homicidal then it is recommended that the patient return to the closest hospital emergency department, the [facility], or call 911 for further evaluation and treatment. National Suicide Prevention Lifeline 1-800-SUICIDE or (613)185-0092.  About 988 988 offers 24/7 access to trained crisis counselors who can help people experiencing mental health-related distress. People can call or text 988 or chat 988lifeline.org for themselves or if they are worried about a loved one who may need crisis support.  Crisis Mobile: [facility]:                     907-063-1285 (for crisis response 24 hours a day) [employer] [facility] [employer] [facility] Hotline:                                            (581)511-6488.
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
## 2022-11-08 NOTE — ED Provider Notes (Signed)
Behavioral Health Urgent Care Medical Screening Exam  Patient Name: Scott [name] MRN: [id number] Date of Evaluation: 11/08/22 Chief Complaint:   Diagnosis:  Final diagnoses:  Borderline personality disorder (HCC)    History of Present illness: Scott [name] is a 22 y.o. adult transgender male to male with a psychiatric history of borderline personality disorder, gender dysphoria, PTSD, MDD, GAD, ADHD and suicide attempts presenting to [facility] via safe transport with thoughts of self-harming.  Patient states that she wanted to come in before she self-harm.  Nurse practitioner assessed patient face to face and reviewed her chart. Patient is alert oriented x4, calm and cooperative, speech is clear and coherent, thought process is linear and goal directed, mood is euthymic with coherent affect. Patient reports that he was having thoughts of hurting himself. Patient reports that she lives with her Dad and her sister. Patient reports that she was having some depressive thoughts but is no longer having those thoughts. Patient has been stressed with thoughts of having to move out of her father's home within the month but is currently unemployed.  Patient denies any alcohol or substance use at this time.  Patient denies any SI/HI or AVH.  Patient does not appear to be responding to any internal or external stimuli or be experiencing any paranoia, delusions or mania. Patient is being followed by [facility] outpatient psychiatry and will be starting weekly therapy with [facility] next week.    Patient does not meet criteria for inpatient treatment and is able to contract for safety.  Patient will be discharged to follow-up with her outpatient mental health provider and [facility] outpatient clinic.  Flowsheet Row ED from 11/07/2022 in [facility] ED from 08/23/2022 in [facility] ED from 06[facility]14/2022 in [facility] Emergency Department at [facility]  C-SSRS RISK CATEGORY Moderate Risk Moderate Risk No Risk       Psychiatric Specialty Exam  Presentation  General Appearance:Casual  Eye Contact:Fair  Speech:Clear and Coherent  Speech Volume:Normal  Handedness:Right   Mood and Affect  Mood: Euthymic  Affect: Congruent   Thought Process  Thought Processes: Coherent  Descriptions of Associations:Intact  Orientation:Full (Time, Place and Person)  Thought Content:WDL  Diagnosis of Schizophrenia or Schizoaffective disorder in past: No   Hallucinations:None  Ideas of Reference:None  Suicidal Thoughts:No Without Intent; Without Plan; Without Means to Carry Out; Without Access to Means  Homicidal Thoughts:No   Sensorium  Memory: Immediate Fair; Recent Fair; Remote Fair  Judgment: Fair  Insight: Fair   Art therapist  Concentration: Fair  Attention Span: Fair  Recall: [employer] of Knowledge: Fair  Language: Fair   Psychomotor Activity  Psychomotor Activity: Normal   Assets  Assets: Housing; Physical Health; Communication Skills   Sleep  Sleep: Fair  Number of hours:  6   Physical Exam: Physical Exam Constitutional:      Appearance: Normal appearance.  HENT:     Head: Normocephalic and atraumatic.     Nose: Nose normal.  Eyes:     Pupils: Pupils are equal, round, and reactive to light.  Cardiovascular:     Rate and Rhythm: Normal rate.  Pulmonary:     Effort: Pulmonary effort is normal.  Abdominal:     General: Abdomen is flat.  Musculoskeletal:        General: Normal range of motion.     Cervical back: Normal range of motion.  Skin:  General: Skin is warm.  Neurological:     Mental Status: She is alert and oriented to person, place, and time.  Psychiatric:        Attention and Perception: Attention normal.        Mood and Affect: Mood normal.        Speech: Speech normal.        Behavior: Behavior is cooperative.        Thought  Content: Thought content normal.        Cognition and Memory: Cognition normal.        Judgment: Judgment is impulsive.    Review of Systems  Constitutional: Negative.   HENT: Negative.    Eyes: Negative.   Respiratory: Negative.    Cardiovascular: Negative.   Genitourinary: Negative.   Musculoskeletal: Negative.   Neurological: Negative.   Endo/Heme/Allergies: Negative.   Psychiatric/Behavioral: Negative.     Blood pressure 130/77, pulse 82, temperature 98.6 F (37 C), temperature source Oral, resp. rate 18, SpO2 97%. There is no height or weight on file to calculate BMI.  Musculoskeletal: Strength & Muscle Tone: within normal limits Gait & Station: normal Patient leans: N/A   [facility] MSE Discharge Disposition for Follow up and Recommendations: Based on my evaluation the patient does not appear to have an emergency medical condition and can be discharged with resources and follow up care in outpatient services for Medication Management and Individual Therapy    [name], NP 11/08/2022, 3:04 AM

## 2022-11-24 ENCOUNTER — Ambulatory Visit (HOSPITAL_COMMUNITY): Payer: MEDICAID

## 2022-12-03 ENCOUNTER — Encounter (HOSPITAL_COMMUNITY): Payer: Self-pay

## 2022-12-03 ENCOUNTER — Ambulatory Visit (HOSPITAL_COMMUNITY): Payer: MEDICAID

## 2022-12-08 ENCOUNTER — Telehealth (HOSPITAL_COMMUNITY): Payer: Self-pay

## 2022-12-08 DIAGNOSIS — F3132 Bipolar disorder, current episode depressed, moderate: Secondary | ICD-10-CM

## 2022-12-08 NOTE — Telephone Encounter (Signed)
Fax received from [facility], Raynelle Fanning is not enrolled in [place], please resend the Abilify shot in your supervising doctors name, thank you  Case reviewed with Dr. Cyndie Chime. Abilify Maintenna called in.

## 2022-12-09 MED ORDER — ABILIFY MAINTENA 400 MG IM PRSY
400.0000 mg | PREFILLED_SYRINGE | INTRAMUSCULAR | 2 refills | Status: DC
Start: 2022-12-09 — End: 2022-12-10

## 2022-12-10 ENCOUNTER — Ambulatory Visit (HOSPITAL_COMMUNITY): Payer: MEDICAID

## 2022-12-10 ENCOUNTER — Ambulatory Visit (HOSPITAL_COMMUNITY): Payer: Medicaid Other

## 2022-12-10 ENCOUNTER — Other Ambulatory Visit (HOSPITAL_COMMUNITY): Payer: Self-pay

## 2022-12-10 ENCOUNTER — Encounter (HOSPITAL_COMMUNITY): Payer: Self-pay | Admitting: Student

## 2022-12-10 ENCOUNTER — Ambulatory Visit (INDEPENDENT_AMBULATORY_CARE_PROVIDER_SITE_OTHER): Payer: Medicaid Other | Admitting: Student

## 2022-12-10 ENCOUNTER — Telehealth (HOSPITAL_COMMUNITY): Payer: Self-pay

## 2022-12-10 DIAGNOSIS — F3132 Bipolar disorder, current episode depressed, moderate: Secondary | ICD-10-CM

## 2022-12-10 DIAGNOSIS — Z9151 Personal history of suicidal behavior: Secondary | ICD-10-CM

## 2022-12-10 DIAGNOSIS — F603 Borderline personality disorder: Secondary | ICD-10-CM

## 2022-12-10 DIAGNOSIS — Z8659 Personal history of other mental and behavioral disorders: Secondary | ICD-10-CM | POA: Diagnosis not present

## 2022-12-10 MED ORDER — ABILIFY MAINTENA 400 MG IM PRSY
400.0000 mg | PREFILLED_SYRINGE | INTRAMUSCULAR | 2 refills | Status: DC
Start: 2022-12-10 — End: 2023-03-10

## 2022-12-10 NOTE — Telephone Encounter (Signed)
Patient did not come in for her shot so I called to follow up. Patient states that the medication was sent to a pharmacy in [facility] and she could not pick it up. I resent the medication to the correct pharmacy. I called to be sure they had it in stock. I called patient back to let her know and she said she was unable to come today due to transportation. I asked the front desk to please call and reschedule her asap. I called patient and let her know

## 2022-12-17 ENCOUNTER — Encounter (HOSPITAL_COMMUNITY): Payer: Self-pay

## 2022-12-17 ENCOUNTER — Ambulatory Visit (HOSPITAL_COMMUNITY): Payer: Medicaid Other

## 2022-12-17 MED ORDER — ARIPIPRAZOLE ER 400 MG IM PRSY
400.0000 mg | PREFILLED_SYRINGE | Freq: Once | INTRAMUSCULAR | Status: AC
  Administered 2022-12-17: 400 mg via INTRAMUSCULAR

## 2022-12-17 NOTE — Progress Notes (Cosign Needed)
PATIENT PRESENTS TO THE OFFICE FOR ABILIFY MAINTENNA 400 MG INJECTION GIVEN BY Dagmawi Venable , PT TOLERATED INJECTION WELL IN  RIGHT DELTOID AND WILL RETURN IN 28 DAYS

## 2022-12-22 ENCOUNTER — Encounter (HOSPITAL_COMMUNITY): Payer: MEDICAID | Admitting: Student

## 2022-12-24 ENCOUNTER — Encounter (HOSPITAL_COMMUNITY): Payer: Self-pay | Admitting: Emergency Medicine

## 2022-12-24 ENCOUNTER — Other Ambulatory Visit: Payer: Self-pay

## 2022-12-24 ENCOUNTER — Emergency Department (HOSPITAL_COMMUNITY)
Admission: EM | Admit: 2022-12-24 | Discharge: 2022-12-25 | Disposition: A | Discharge status: Other Acute Inpatient Hospital | Payer: MEDICAID | Attending: Emergency Medicine | Admitting: Emergency Medicine

## 2022-12-24 DIAGNOSIS — R45851 Suicidal ideations: Secondary | ICD-10-CM | POA: Diagnosis not present

## 2022-12-24 DIAGNOSIS — R4689 Other symptoms and signs involving appearance and behavior: Secondary | ICD-10-CM | POA: Insufficient documentation

## 2022-12-24 DIAGNOSIS — R29898 Other symptoms and signs involving the musculoskeletal system: Secondary | ICD-10-CM

## 2022-12-24 LAB — COMPREHENSIVE METABOLIC PANEL
ALT: 18 U/L (ref 0–44)
AST: 18 U/L (ref 15–41)
Albumin: 4.1 g/dL (ref 3.5–5.0)
Alkaline Phosphatase: 88 U/L (ref 38–126)
Anion gap: 9 (ref 5–15)
BUN: 17 mg/dL (ref 6–20)
CO2: 24 mmol/L (ref 22–32)
Calcium: 8.8 mg/dL — ABNORMAL LOW (ref 8.9–10.3)
Chloride: 103 mmol/L (ref 98–111)
Creatinine, Ser: 0.61 mg/dL (ref 0.61–1.24)
GFR, Estimated: >60 mL/min (ref >60)
Glucose, Bld: 105 mg/dL — ABNORMAL HIGH (ref 70–99)
Potassium: 3.8 mmol/L (ref 3.5–5.1)
Sodium: 136 mmol/L (ref 135–145)
Total Bilirubin: 0.2 mg/dL (ref <1.2)
Total Protein: 7.3 g/dL (ref 6.5–8.1)

## 2022-12-24 LAB — RAPID URINE DRUG SCREEN, HOSP PERFORMED
Amphetamines: NOT DETECTED
Barbiturates: NOT DETECTED
Benzodiazepines: NOT DETECTED
Cocaine: NOT DETECTED
Opiates: NOT DETECTED
Tetrahydrocannabinol: NOT DETECTED

## 2022-12-24 LAB — ETHANOL: Alcohol, Ethyl (B): <10 mg/dL (ref <10)

## 2022-12-24 LAB — CBC
HCT: 39.9 % (ref 39.0–52.0)
Hemoglobin: 13.7 g/dL (ref 13.0–17.0)
MCH: 30.1 pg (ref 26.0–34.0)
MCHC: 34.3 g/dL (ref 30.0–36.0)
MCV: 87.7 fL (ref 80.0–100.0)
Platelets: 245 10*3/uL (ref 150–400)
RBC: 4.55 MIL/uL (ref 4.22–5.81)
RDW: 12.1 % (ref 11.5–15.5)
WBC: 9.7 10*3/uL (ref 4.0–10.5)
nRBC: 0 % (ref 0.0–0.2)

## 2022-12-24 LAB — SALICYLATE LEVEL: Salicylate Lvl: <7 mg/dL — ABNORMAL LOW (ref 7.0–30.0)

## 2022-12-24 LAB — ACETAMINOPHEN LEVEL: Acetaminophen (Tylenol), Serum: <10 ug/mL — ABNORMAL LOW (ref 10–30)

## 2022-12-24 LAB — CBG MONITORING, ED: Glucose-Capillary: 106 mg/dL — ABNORMAL HIGH (ref 70–99)

## 2022-12-24 NOTE — ED Provider Notes (Cosign Needed Addendum)
[place] [facility]ERGENCY DEPARTMENT AT [facility] Provider Note   CSN: [id number] Arrival date & time: 12/24/22  2104     History  Chief Complaint  Patient presents with   Suicidal    Scott [name] is a 22 y.o. adult transgender male on HRT with progesterone who presents after suicide attempt.  Patient states that she was feeling hopeless and scared for her future, and possibility of losing access to HRT in context of recent presidential election result.  As a result took at least 6 capsules of progesterone 200 mg each.  States that he is feeling very sleepy at this time but denies any other ingestion.  Immediately called EMS subsequently and states that she regrets her behavior.  Does follow outpatient with therapy with peer support with step-by-step.  Abdominal pain constipation  HPI     Home Medications Prior to Admission medications   Medication Sig Start Date End Date Taking? Authorizing Provider  ARIPiprazole ER (ABILIFY MAINTENA) 400 MG PRSY prefilled syringe Inject 400 mg into the muscle every 28 (twenty-eight) days. 12/10/22   Elsie Lincoln, MD  desvenlafaxine (PRISTIQ) 50 MG 24 hr tablet Take 1 tablet (50 mg total) by mouth every morning for 14 days, THEN 2 tablets (100 mg total) every morning. 11/05/22 01/18/23  Princess Bruins, DO  estradiol valerate (DELESTROGEN) 20 MG/ML injection Inject 20 mg into the muscle every 28 (twenty-eight) days. 09/15/22   [provider]  guanFACINE (INTUNIV) 2 MG TB24 ER tablet Take 1 tablet (2 mg total) by mouth daily. 11/05/22 02/03/23  Princess Bruins, DO  hydrOXYzine (ATARAX) 25 MG tablet Take 1 tablet (25 mg total) by mouth daily as needed for anxiety. 11/05/22 02/03/23  Princess Bruins, DO  metoprolol succinate (TOPROL-XL) 25 MG 24 hr tablet Take 12.5 mg by mouth daily. 09/22/22   [provider]  progesterone (PROMETRIUM) 100 MG capsule Take 1 capsule (100 mg total) by mouth daily. 07/22/21   Verneda Skill, [name]   triamcinolone ointment (KENALOG) 0.5 % Apply 1 application. topically 2 (two) times daily. Patient taking differently: Apply 1 application  topically 2 (two) times daily as needed (for dermatitis). 04/29/21   Verneda Skill, [name]      Allergies    Patient has no known allergies.    Review of Systems   Review of Systems  Psychiatric/Behavioral:  Positive for suicidal ideas.     Physical Exam Updated Vital Signs BP 113/74   Pulse 86   Temp 97.8 F (36.6 C) (Oral)   Resp 17   Wt 83.9 kg   SpO2 98%   BMI 30.79 kg/m  Physical Exam Vitals and nursing note reviewed.  Constitutional:      Appearance: She is not toxic-appearing.  HENT:     Head: Normocephalic and atraumatic.     Mouth/Throat:     Mouth: Mucous membranes are moist.     Pharynx: No oropharyngeal exudate or posterior oropharyngeal erythema.  Eyes:     General:        Right eye: No discharge.        Left eye: No discharge.     Conjunctiva/sclera: Conjunctivae normal.  Cardiovascular:     Rate and Rhythm: Normal rate and regular rhythm.     Pulses: Normal pulses.     Heart sounds: Normal heart sounds. No murmur heard. Pulmonary:     Effort: Pulmonary effort is normal. No respiratory distress.     Breath sounds: Normal breath sounds. No  wheezing or rales.  Abdominal:     General: Bowel sounds are normal. There is no distension.     Palpations: Abdomen is soft.     Tenderness: There is no abdominal tenderness.  Musculoskeletal:        General: No deformity.     Cervical back: Neck supple.     Right lower leg: No edema.     Left lower leg: No edema.  Skin:    General: Skin is warm and dry.     Capillary Refill: Capillary refill takes less than 2 seconds.  Neurological:     General: No focal deficit present.     Mental Status: She is alert and oriented to person, place, and time. Mental status is at baseline.  Psychiatric:        Attention and Perception: Attention normal.        Mood and Affect: Mood  normal.        Speech: Speech normal.        Behavior: Behavior normal.        Thought Content: Thought content includes suicidal ideation. Thought content does not include homicidal ideation. Thought content includes suicidal plan.     ED Results / Procedures / Treatments   Labs (all labs ordered are listed, but only abnormal results are displayed) Labs Reviewed  COMPREHENSIVE METABOLIC PANEL - Abnormal; Notable for the following components:      Result Value   Glucose, Bld 105 (*)    Calcium 8.8 (*)    All other components within normal limits  SALICYLATE LEVEL - Abnormal; Notable for the following components:   Salicylate Lvl <7.0 (*)    All other components within normal limits  ACETAMINOPHEN LEVEL - Abnormal; Notable for the following components:   Acetaminophen (Tylenol), Serum <10 (*)    All other components within normal limits  ACETAMINOPHEN LEVEL - Abnormal; Notable for the following components:   Acetaminophen (Tylenol), Serum <10 (*)    All other components within normal limits  CBG MONITORING, ED - Abnormal; Notable for the following components:   Glucose-Capillary 106 (*)    All other components within normal limits  ETHANOL  CBC  RAPID URINE DRUG SCREEN, HOSP PERFORMED    EKG None  Radiology No results found.  Procedures Procedures    Medications Ordered in ED Medications  ondansetron (ZOFRAN-ODT) disintegrating tablet 4 mg (4 mg Oral Patient Refused/Not Given 12/25/22 0506)    ED Course/ Medical Decision Making/ A&P Clinical Course as of 12/25/22 0606  Fri Dec 25, 2022  0137 Per poison control RN, labs and EKG reviewed, recommendations for repeat Tylenol level now at the 4 h mark, and if normal will be cleared per poison control. Likely ramifications of ingestion with progesterone an extension of normal side effects of progesterone with drowsiness and GI upset.  I appreciate her collaboration in the care of this patient.  [RS]    Clinical Course  User Index [RS] Hassan Blackshire, Eugene Gavia, PA-C                                 Medical Decision Making 22 y/o Transgender male who presents after suicide attempt with progesterone.  Amount and/or Complexity of Data Reviewed Labs: ordered.    Details: CBC unremarkable, CMP unremarkable, salicylate acetaminophen and alcohol levels are normal.  CBG normal.  UDS in process. Repeat acetaminophen normal.  ECG/medicine tests:     Details:  EKG with sinus rhythm.  Risk Prescription drug management. Decision regarding hospitalization.   MEDICALLY CLEARED FOR TTS. Per psych NP Sindy Guadeloupe, patient meets inpatient criteria. Patient remains voluntary at this time.   [name]  voiced understanding of her medical evaluation and treatment plan. Each of their questions answered to their expressed satisfaction. This chart was dictated using voice recognition software, Dragon. Despite the best efforts of this provider to proofread and correct errors, errors may still occur which can change documentation meaning.          Final Clinical Impression(s) / ED Diagnoses Final diagnoses:  None    Rx / DC Orders ED Discharge Orders     None         Paris Lore, PA-C 12/25/22 0606    Rangel Echeverri, Eugene Gavia, PA-C 12/25/22 0606    Charlynne Pander, MD 12/28/22 [phone]

## 2022-12-24 NOTE — ED Notes (Signed)
Medications given Alphonse Guild to take home. She can be reached at [phone], friend of patient.

## 2022-12-24 NOTE — ED Notes (Signed)
Contacted poison control. Recommendations for EKG, tylenol level, repeat tylenol at 10:30pm. Ok to observe for 6 hrs post ingestion. Spoke with Dennie Bible for this case at poison control.

## 2022-12-24 NOTE — ED Triage Notes (Addendum)
Presents via EMS for SI. Called by pt. States to EMS that he took 6 capsules of 200mg  progesterone with the intent of killing self. Denies HI, hallucinations    EMS VS: 130/88, 86bpm, 98% RA

## 2022-12-25 ENCOUNTER — Inpatient Hospital Stay (HOSPITAL_COMMUNITY)
Admission: AD | Admit: 2022-12-25 | Discharge: 2022-12-30 | DRG: 885 | Disposition: A | Discharge status: Home / Self Care | Payer: MEDICAID | Source: Intra-hospital | Attending: Psychiatry | Admitting: Psychiatry

## 2022-12-25 ENCOUNTER — Encounter (HOSPITAL_COMMUNITY): Payer: Self-pay | Admitting: Adult Health

## 2022-12-25 DIAGNOSIS — Z818 Family history of other mental and behavioral disorders: Secondary | ICD-10-CM

## 2022-12-25 DIAGNOSIS — Z9151 Personal history of suicidal behavior: Secondary | ICD-10-CM

## 2022-12-25 DIAGNOSIS — F332 Major depressive disorder, recurrent severe without psychotic features: Secondary | ICD-10-CM | POA: Diagnosis present

## 2022-12-25 DIAGNOSIS — F603 Borderline personality disorder: Secondary | ICD-10-CM | POA: Diagnosis present

## 2022-12-25 DIAGNOSIS — Z808 Family history of malignant neoplasm of other organs or systems: Secondary | ICD-10-CM

## 2022-12-25 DIAGNOSIS — F410 Panic disorder [episodic paroxysmal anxiety] without agoraphobia: Secondary | ICD-10-CM | POA: Diagnosis present

## 2022-12-25 DIAGNOSIS — Z79899 Other long term (current) drug therapy: Secondary | ICD-10-CM

## 2022-12-25 DIAGNOSIS — Z23 Encounter for immunization: Secondary | ICD-10-CM

## 2022-12-25 DIAGNOSIS — G4700 Insomnia, unspecified: Secondary | ICD-10-CM | POA: Diagnosis present

## 2022-12-25 DIAGNOSIS — F411 Generalized anxiety disorder: Secondary | ICD-10-CM | POA: Diagnosis present

## 2022-12-25 DIAGNOSIS — F902 Attention-deficit hyperactivity disorder, combined type: Secondary | ICD-10-CM | POA: Diagnosis present

## 2022-12-25 DIAGNOSIS — F4310 Post-traumatic stress disorder, unspecified: Secondary | ICD-10-CM | POA: Diagnosis present

## 2022-12-25 DIAGNOSIS — Z813 Family history of other psychoactive substance abuse and dependence: Secondary | ICD-10-CM

## 2022-12-25 DIAGNOSIS — Z811 Family history of alcohol abuse and dependence: Secondary | ICD-10-CM | POA: Diagnosis not present

## 2022-12-25 DIAGNOSIS — F640 Transsexualism: Secondary | ICD-10-CM | POA: Diagnosis present

## 2022-12-25 DIAGNOSIS — R4689 Other symptoms and signs involving appearance and behavior: Secondary | ICD-10-CM | POA: Diagnosis not present

## 2022-12-25 DIAGNOSIS — Z8249 Family history of ischemic heart disease and other diseases of the circulatory system: Secondary | ICD-10-CM

## 2022-12-25 DIAGNOSIS — T50902A Poisoning by unspecified drugs, medicaments and biological substances, intentional self-harm, initial encounter: Secondary | ICD-10-CM | POA: Diagnosis present

## 2022-12-25 DIAGNOSIS — Z5986 Financial insecurity: Secondary | ICD-10-CM | POA: Diagnosis not present

## 2022-12-25 DIAGNOSIS — F17290 Nicotine dependence, other tobacco product, uncomplicated: Secondary | ICD-10-CM | POA: Diagnosis present

## 2022-12-25 LAB — ACETAMINOPHEN LEVEL: Acetaminophen (Tylenol), Serum: <10 ug/mL — ABNORMAL LOW (ref 10–30)

## 2022-12-25 MED ORDER — ESTRADIOL VALERATE 20 MG/ML IM OIL: 20.0000 mg | TOPICAL_OIL | INTRAMUSCULAR | Status: DC

## 2022-12-25 MED ORDER — ONDANSETRON 4 MG PO TBDP
4.0000 mg | ORAL_TABLET | Freq: Once | ORAL | Status: AC
  Administered 2022-12-25: 4 mg via ORAL
  Filled 2022-12-25 (×2): qty 1

## 2022-12-25 MED ORDER — MAGNESIUM HYDROXIDE 400 MG/5ML PO SUSP
30.0000 mL | Freq: Every day | ORAL | Status: DC | PRN
  Administered 2022-12-28: 30 mL via ORAL
  Filled 2022-12-25: qty 30

## 2022-12-25 MED ORDER — ACETAMINOPHEN 325 MG PO TABS
650.0000 mg | ORAL_TABLET | Freq: Four times a day (QID) | ORAL | Status: DC | PRN
  Administered 2022-12-26 – 2022-12-30 (×6): 650 mg via ORAL
  Filled 2022-12-25 (×6): qty 2

## 2022-12-25 MED ORDER — LEVOCETIRIZINE DIHYDROCHLORIDE 5 MG PO TABS: 5.0000 mg | ORAL_TABLET | Freq: Every day | ORAL | Status: DC

## 2022-12-25 MED ORDER — ESTRADIOL VALERATE 20 MG/ML IM OIL
20.0000 mg | TOPICAL_OIL | INTRAMUSCULAR | Status: DC
  Administered 2022-12-27: 20 mg via INTRAMUSCULAR
  Filled 2022-12-25: qty 1

## 2022-12-25 MED ORDER — HYDROXYZINE HCL 25 MG PO TABS
25.0000 mg | ORAL_TABLET | Freq: Three times a day (TID) | ORAL | Status: DC | PRN
  Administered 2022-12-25: 25 mg via ORAL
  Filled 2022-12-25: qty 1

## 2022-12-25 MED ORDER — TRAZODONE HCL 100 MG PO TABS: 100.0000 mg | ORAL_TABLET | Freq: Every evening | ORAL | Status: DC | PRN

## 2022-12-25 MED ORDER — LORATADINE 5 MG/5ML PO SOLN
10.0000 mg | Freq: Every day | ORAL | Status: DC
  Filled 2022-12-25: qty 10

## 2022-12-25 MED ORDER — MONTELUKAST SODIUM 10 MG PO TABS
10.0000 mg | ORAL_TABLET | Freq: Every day | ORAL | Status: DC
  Filled 2022-12-25: qty 1

## 2022-12-25 MED ORDER — METOPROLOL SUCCINATE 12.5 MG HALF TABLET
12.5000 mg | ORAL_TABLET | Freq: Every day | ORAL | Status: DC
  Administered 2022-12-26 – 2022-12-30 (×5): 12.5 mg via ORAL
  Filled 2022-12-25 (×7): qty 1

## 2022-12-25 MED ORDER — PROGESTERONE 200 MG PO CAPS
200.0000 mg | ORAL_CAPSULE | Freq: Every day | ORAL | Status: DC
  Administered 2022-12-26: 200 mg via ORAL
  Filled 2022-12-25 (×2): qty 1

## 2022-12-25 MED ORDER — TRAZODONE HCL 100 MG PO TABS
100.0000 mg | ORAL_TABLET | Freq: Every evening | ORAL | Status: DC | PRN
Start: 2022-12-25 — End: 2022-12-28
  Administered 2022-12-25 – 2022-12-27 (×3): 100 mg via ORAL
  Filled 2022-12-25 (×3): qty 1

## 2022-12-25 MED ORDER — HYDROXYZINE HCL 25 MG PO TABS
25.0000 mg | ORAL_TABLET | Freq: Three times a day (TID) | ORAL | Status: DC | PRN
  Administered 2022-12-25 – 2022-12-29 (×5): 25 mg via ORAL
  Filled 2022-12-25 (×5): qty 1

## 2022-12-25 MED ORDER — PROGESTERONE MICRONIZED 100 MG PO CAPS
200.0000 mg | ORAL_CAPSULE | Freq: Every day | ORAL | Status: DC
  Filled 2022-12-25: qty 2

## 2022-12-25 MED ORDER — MONTELUKAST SODIUM 10 MG PO TABS
10.0000 mg | ORAL_TABLET | Freq: Every day | ORAL | Status: DC
  Administered 2022-12-25 – 2022-12-29 (×5): 10 mg via ORAL
  Filled 2022-12-25 (×8): qty 1

## 2022-12-25 MED ORDER — METOPROLOL SUCCINATE ER 25 MG PO TB24
12.5000 mg | ORAL_TABLET | Freq: Every day | ORAL | Status: DC
  Administered 2022-12-25: 12.5 mg via ORAL
  Filled 2022-12-25: qty 1

## 2022-12-25 MED ORDER — ALUM & MAG HYDROXIDE-SIMETH 200-200-20 MG/5ML PO SUSP: 30.0000 mL | ORAL | Status: DC | PRN

## 2022-12-25 MED ORDER — GUANFACINE HCL ER 1 MG PO TB24
2.0000 mg | ORAL_TABLET | Freq: Every day | ORAL | Status: DC
  Administered 2022-12-25: 2 mg via ORAL
  Filled 2022-12-25: qty 2

## 2022-12-25 MED ORDER — INFLUENZA VIRUS VACC SPLIT PF (FLUZONE) 0.5 ML IM SUSY
0.5000 mL | PREFILLED_SYRINGE | INTRAMUSCULAR | Status: AC
  Administered 2022-12-27: 0.5 mL via INTRAMUSCULAR
  Filled 2022-12-25: qty 0.5

## 2022-12-25 MED ORDER — GUANFACINE HCL ER 2 MG PO TB24
2.0000 mg | ORAL_TABLET | Freq: Every day | ORAL | Status: DC
  Administered 2022-12-26 – 2022-12-30 (×5): 2 mg via ORAL
  Filled 2022-12-25 (×7): qty 1

## 2022-12-25 NOTE — Progress Notes (Signed)
Pt was accepted to [facility] TODAY 12/25/2022; Bed Assignment 406  Pt meets inpatient criteria per Lavonna Monarch  Attending Physician will be Dr. Phineas Inches, MD  Report can be called to:  -Adult unit: [phone]  Pt can arrive after: BED IS READY  Care Team notified:Shnese Mills,NP, Inez Pilgrim, Jamal Maes, [facility] [place], [place] [facility], [place] [address]   [place], [place] 12/25/2022 @ 5:20 PM

## 2022-12-25 NOTE — [facility] Group Notes (Signed)
[facility] Group Notes:  (Nursing/MHT/Case Management/Adjunct)  Date:  12/25/2022  Time:  8:26 PM  Type of Therapy:   AA group  Participation Level:  Did Not Attend  Participation Quality:    Affect:    Cognitive:    Insight:    Engagement in Group:    Modes of Intervention:    Summary of Progress/Problems:  Refused to attend group. Writer provided coping skills worksheet for her to work on while in her room.   Noah Delaine 12/25/2022, 8:26 PM

## 2022-12-25 NOTE — Progress Notes (Signed)
   12/25/22 2100  Psych Admission Type (Psych Patients Only)  Admission Status Involuntary  Psychosocial Assessment  Patient Complaints Anxiety;Depression;Insomnia  Eye Contact Fair  Facial Expression Animated  Affect Depressed  Speech Logical/coherent  Interaction Assertive  Motor Activity Slow  Appearance/Hygiene Disheveled  Behavior Characteristics Cooperative  Mood Depressed;Anxious  Thought Process  Coherency WDL  Content Blaming others  Delusions None reported or observed  Perception WDL  Hallucination None reported or observed  Judgment Impaired  Confusion None  Danger to Self  Current suicidal ideation? Denies  Agreement Not to Harm Self Yes  Description of Agreement Verbal  Danger to Others  Danger to Others None reported or observed
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
## 2022-12-25 NOTE — ED Provider Notes (Signed)
Emergency Medicine Observation Re-evaluation Note  Scott [name] is a 22 y.o. adult, seen on rounds today.  Pt initially presented to the ED for complaints of Suicidal Currently, the patient is on their bed, calm and cooperative.  Physical Exam  BP 118/80   Pulse 97   Temp (!) 97.5 F (36.4 C) (Oral)   Resp 18   Wt 83.9 kg   SpO2 100%   BMI 30.79 kg/m  Physical Exam Vitals and nursing note reviewed.  Constitutional:      General: She is not in acute distress.    Appearance: She is well-developed.  HENT:     Head: Normocephalic and atraumatic.  Eyes:     Conjunctiva/sclera: Conjunctivae normal.  Cardiovascular:     Rate and Rhythm: Normal rate and regular rhythm.     Heart sounds: No murmur heard. Pulmonary:     Effort: Pulmonary effort is normal. No respiratory distress.     Breath sounds: Normal breath sounds.  Abdominal:     Palpations: Abdomen is soft.     Tenderness: There is no abdominal tenderness.  Musculoskeletal:        General: No swelling.     Cervical back: Neck supple.  Skin:    General: Skin is warm and dry.     Capillary Refill: Capillary refill takes less than 2 seconds.  Neurological:     Mental Status: She is alert.  Psychiatric:        Mood and Affect: Mood normal.      ED Course / MDM  EKG:   I have reviewed the labs performed to date as well as medications administered while in observation.  Recent changes in the last 24 hours include medical clearance, being evaluated by psychiatry.  Patient has a bed at [facility].  I have ordered an IVC. Plan  Current plan is for admission to [facility].Anders Simmonds T, DO 12/25/22 1206
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
## 2022-12-25 NOTE — Plan of Care (Signed)
  Problem: Education: Goal: Knowledge of [place] [facility] Education information/materials will improve Outcome: Progressing Goal: Verbalization of understanding the information provided will improve Outcome: Progressing   

## 2022-12-25 NOTE — ED Notes (Signed)
Pt's items are in Cabinet C- gray short, blue gray shirt, black shoes and cell phone in bag.

## 2022-12-26 DIAGNOSIS — F332 Major depressive disorder, recurrent severe without psychotic features: Secondary | ICD-10-CM | POA: Diagnosis not present

## 2022-12-26 MED ORDER — TRIAMCINOLONE ACETONIDE 0.5 % EX CREA
1.0000 | TOPICAL_CREAM | Freq: Two times a day (BID) | CUTANEOUS | Status: DC
  Administered 2022-12-26 – 2022-12-30 (×8): 1 via TOPICAL
  Filled 2022-12-26: qty 15

## 2022-12-26 MED ORDER — DESVENLAFAXINE SUCCINATE ER 50 MG PO TB24
150.0000 mg | ORAL_TABLET | Freq: Every day | ORAL | Status: DC
  Administered 2022-12-26 – 2022-12-30 (×5): 150 mg via ORAL
  Filled 2022-12-26 (×8): qty 3
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
## 2022-12-26 NOTE — Plan of Care (Signed)
  Problem: Education: Goal: Emotional status will improve Outcome: Progressing Goal: Mental status will improve Outcome: Progressing   
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
## 2022-12-26 NOTE — BHH Group Notes (Signed)
Adult Psychoeducational Group Note  Date:  12/26/2022 Time:  12:07 PM  Group Topic/Focus:  Emotional Education:   The focus of this group is to discuss what feelings/emotions are, and how they are experienced. Goals Group:   The focus of this group is to help patients establish daily goals to achieve during treatment and discuss how the patient can incorporate goal setting into their daily lives to aide in recovery.  Participation Level:  Did Not Attend  Participation Quality:  na  Affect:  na  Cognitive:  na  Insight: na  Engagement in Group:  na  Modes of Intervention:  na  Additional Comments:  Pt did not attend group  Scott [name] 12/26/2022, 12:07 PM

## 2022-12-26 NOTE — Plan of Care (Signed)
  Problem: Education: Goal: Knowledge of [facility] Education information/materials will improve Outcome: Progressing   Problem: Activity: Goal: Sleeping patterns will improve Outcome: Progressing   Problem: Coping: Goal: Ability to verbalize frustrations and anger appropriately will improve Outcome: Progressing   Problem: Safety: Goal: Periods of time without injury will increase Outcome: Progressing   Problem: Health Behavior/Discharge Planning: Goal: Compliance with therapeutic regimen will improve Outcome: Progressing

## 2022-12-26 NOTE — BHH Group Notes (Signed)
Adult Psychoeducational Group Note  Date:  12/26/2022 Time:  3:26 PM  Group Topic/Focus:  Building Self Esteem:   The Focus of this group is helping patients become aware of the effects of self-esteem on their lives, the things they and others do that enhance or undermine their self-esteem, seeing the relationship between their level of self-esteem and the choices they make and learning ways to enhance self-esteem.  Participation Level:  Active  Participation Quality:  Appropriate  Affect:  Appropriate  Cognitive:  Appropriate  Insight: Appropriate  Engagement in Group:  Engaged  Modes of Intervention:  Exploration  Additional Comments:  Pt participated in group. MHT led a group based on identifying their strengths using worksheet from Anger Management "Can't, Never, Could." Patients identified their strengths. Patients ended the group with writing what they are grateful for and a positive affirmation.   Scott [name] 12/26/2022, 3:26 PM
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
## 2022-12-26 NOTE — Progress Notes (Signed)
Patient Rated her depression 5/10, anxiety 9/10. Patient denies SI,HI, & AVH. Patient received PRN Trazodone for sleep and Hydroxyzine for anxiety without any side effects. Patient verbally contract for safety. Routine safety checks maintained.

## 2022-12-26 NOTE — Progress Notes (Signed)
   12/26/22 0900  Psych Admission Type (Psych Patients Only)  Admission Status Involuntary  Psychosocial Assessment  Patient Complaints Anxiety;Depression  Eye Contact Fair  Facial Expression Animated  Affect Anxious;Depressed  Speech Logical/coherent  Interaction Assertive  Motor Activity Slow  Appearance/Hygiene Unremarkable  Behavior Characteristics Cooperative;Anxious  Mood Depressed;Anxious  Thought Process  Coherency WDL  Content WDL  Delusions None reported or observed  Perception WDL  Hallucination None reported or observed  Judgment Impaired  Confusion None  Danger to Self  Current suicidal ideation? Denies  Description of Suicide Plan No plan  Agreement Not to Harm Self Yes  Description of Agreement Pt verbally contracts for safety  Danger to Others  Danger to Others None reported or observed

## 2022-12-26 NOTE — Progress Notes (Signed)
   12/26/22 2100  Psych Admission Type (Psych Patients Only)  Admission Status Involuntary  Psychosocial Assessment  Patient Complaints Depression  Eye Contact Fair  Facial Expression Animated  Affect Anxious;Depressed  Speech Logical/coherent  Interaction Assertive  Motor Activity Slow  Appearance/Hygiene Unremarkable  Behavior Characteristics Cooperative;Anxious  Mood Depressed;Anxious  Thought Process  Coherency WDL  Content WDL  Delusions None reported or observed  Perception WDL  Hallucination None reported or observed  Judgment Impaired  Confusion None  Danger to Self  Current suicidal ideation? Denies  Agreement Not to Harm Self Yes  Description of Agreement Verbal  Danger to Others  Danger to Others None reported or observed

## 2022-12-26 NOTE — [facility] Group Notes (Signed)
[facility] Group Notes:  (Nursing/MHT/Case Management/Adjunct)  Date:  12/26/2022  Time:  9:02 PM  Type of Therapy:  The focus of this group is to discuss what feelings/emotions are, and how they are experienced. Goals Group:   The focus of this group is to help patients establish daily goals to achieve during treatment and discuss how the patient can incorporate goal setting into their daily lives to aide in recovery.  Participation Level:  Active  Participation Quality:  Appropriate  Affect:  Appropriate  Cognitive:  Alert and Appropriate  Insight:  Appropriate and Good  Engagement in Group:  Engaged, Improving, and Supportive  Modes of Intervention:  Socialization and Support  Summary of Progress/Problems: Pt attended group  Granville Lewis 12/26/2022, 9:02 PM

## 2022-12-26 NOTE — Progress Notes (Signed)
   12/26/22 0545  15 Minute Checks  Location Bedroom  Visual Appearance Calm  Behavior Sleeping  Sleep (Behavioral Health Patients Only)  Calculate sleep? (Click Yes once per 24 hr at 0600 safety check) Yes  Documented sleep last 24 hours 8

## 2022-12-27 DIAGNOSIS — F332 Major depressive disorder, recurrent severe without psychotic features: Secondary | ICD-10-CM | POA: Diagnosis not present

## 2022-12-27 NOTE — Progress Notes (Addendum)
D. Pt presents with a sad affect/ depressed mood- brightens a little during interactions. Per pt's self inventory, pt rated her depression,hopelessness and anxiety a 3/0/0, respectively. Pt's stated goal is "to work on a discharge plan." Pt has been visible in the milieu, observed attending SW group this afternoon. Pt currently denies SI/HI and AVH  A. Labs and vitals monitored. Pt given and educated on medications. Pt supported emotionally and encouraged to express concerns and ask questions.   R. Pt remains safe with 15 minute checks. Will continue POC.    12/27/22 1200  Psych Admission Type (Psych Patients Only)  Admission Status Involuntary  Psychosocial Assessment  Patient Complaints Anxiety  Eye Contact Fair  Facial Expression Animated  Affect Appropriate to circumstance  Speech Logical/coherent  Interaction Assertive  Motor Activity Slow  Appearance/Hygiene Unremarkable  Behavior Characteristics Cooperative;Anxious  Mood Anxious;Pleasant  Thought Process  Coherency WDL  Content WDL  Delusions None reported or observed  Perception WDL  Hallucination None reported or observed  Judgment Impaired  Confusion None  Danger to Self  Current suicidal ideation? Denies  Danger to Others  Danger to Others None reported or observed

## 2022-12-27 NOTE — Plan of Care (Signed)
  Problem: Education: Goal: Emotional status will improve Outcome: Progressing   Problem: Activity: Goal: Interest or engagement in activities will improve Outcome: Progressing   Problem: Coping: Goal: Ability to verbalize frustrations and anger appropriately will improve Outcome: Progressing

## 2022-12-27 NOTE — [facility] Group Notes (Signed)
[facility] Group Notes:  (Nursing/MHT/Case Management/Adjunct)  Date:  12/27/2022  Time:  2:27 PM  Type of Therapy:  Psychoeducational Skills  Participation Level:  DID NOT ATTEND.  Participation Quality:  NA  Affect:  NA  Cognitive:  NA  Insight:  None  Engagement in Group:  NA  Modes of Intervention:  NA  Summary of Progress/Problems: did not attend psychoeducational RN GROUp.   Malva Limes 12/27/2022, 2:27 PM

## 2022-12-27 NOTE — Progress Notes (Signed)
   12/27/22 0628  15 Minute Checks  Location Bedroom  Visual Appearance Calm  Behavior Sleeping  Sleep (Behavioral Health Patients Only)  Calculate sleep? (Click Yes once per 24 hr at 0600 safety check) Yes  Documented sleep last 24 hours 8.5

## 2022-12-27 NOTE — Plan of Care (Signed)
  Problem: Education: Goal: Emotional status will improve Outcome: Progressing Goal: Mental status will improve Outcome: Progressing  Patient is isolative to their room denies SI/HI/A/VH and verbally contracts for safety. Medications given per Provider orders. Support and encouragement provided.

## 2022-12-27 NOTE — [facility] Group Notes (Signed)
LCSW Wellness Group Note   12/27/2022 10:45am  Type of Group and Topic: Psychoeducational Group:  Wellness  Participation Level:  Active  Description of Group  Wellness group introduces the topic and its focus on developing healthy habits across the spectrum and its relationship to a decrease in hospital admissions.  Six areas of wellness are discussed: physical, social spiritual, intellectual, occupational, and emotional.  Patients are asked to consider their current wellness habits and to identify areas of wellness where they are interested and able to focus on improvements.    Therapeutic Goals Patients will understand components of wellness and how they can positively impact overall health.  Patients will identify areas of wellness where they have developed good habits. Patients will identify areas of wellness where they would like to make improvements.    Summary of Patient Progress: pt attentive and active during group, made several comments during group discussion.  Pt identified intellectual as a wellness area of strength and financial as wellness area that needs improvement.      Therapeutic Modalities: Cognitive Behavioral Therapy Psychoeducation    Lorri Frederick, LCSW

## 2022-12-27 NOTE — Progress Notes (Signed)
[facility] MD Progress Note  12/27/2022 9:37 AM Scott [name]  MRN:  [id number]  Subjective:    Patient is a 22 year old male to male transgender person with a reported psychiatric history of "depression, anxiety, ADHD, and PTSD" who was admitted to this hospital after suicide attempt by overdose on progesterone. Patient was medically cleared by outside hospital and transferred to the psychiatric unit for evaluation and treatment.   Yesterday, on admission, the psychiatry team made the following recommendations: -Restart Pristiq 150 mg once daily for MDD, GAD, PTSD.  Patient reports that prior to the onset of the identifiable stressor, her mood was stable and her medications were working well -Continue Abilify maintained at 400 mg once monthly, last dose 10/31.  Patient is consistently needed augmentation of Abilify for MDD -Continue hydroxyzine as needed and trazodone as needed for anxiety and sleep -Restart guanfacine 2 mg every morning for ADHD.  This is a home medication   On assessment today, the patient reports her mood is still overwhelmed and worried.  She still reports feeling stressed.  She reports she has started to consider alternative thought process ruminative regarding her all or none thinking regarding the elections and its consequences on her receiving HRT.  She states that she is comforted "that 60,000,000+ people did vote for me to have rights and received my care". She reports poor sleep.  Reports appetite is okay.  Concentration the patient states and still impaired by untreated ADHD, although her outpatient provider is requesting her new testing for this and not prescribing her ADHD medication.  She denies any side effect since resuming her home medications.  She reports she is receiving the reviewed benefit from being in a supportive milieu and receiving group psychotherapy on the unit.  Otherwise she denies any SI.  Denies HI.  Denies any psychotic symptoms.  Reports that  anxiety level still remains elevated.     Principal Problem: <principal problem not specified> Diagnosis: Active Problems:   PTSD (post-traumatic stress disorder)   Borderline personality disorder (HCC)   GAD (generalized anxiety disorder)   MDD (major depressive disorder), recurrent episode, severe (HCC)  Total Time spent with patient: 15 minutes  Past Psychiatric History: MDD, GAD, ADHD, PTSD. Patient reports history of multiple hospitalizations. Reports a history of 3-5 suicide attempts all by overdose. Patient reports current psychiatric medications include guanfacine, Pristiq, and Abilify Maintena, last dose was 10/31. Patient reports her psychiatric medication history also includes Zoloft.    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   History of admission to inpatient psychiatry department 10/06/2022   History of suicide attempt 01/27/2021   Suicide attempt (HCC) 01/27/2021   2022 - overdose on trazodone - wanted to die   12/22 - overdose on estradiol      Past Surgical History:  Procedure Laterality Date   ADENOIDECTOMY     EYE MUSCLE SURGERY Bilateral    x3   MYRINGOTOMY WITH TUBE PLACEMENT Bilateral    TONSILLECTOMY AND ADENOIDECTOMY Bilateral 02/03/2013   Procedure: TONSILLECTOMY AND ADENOIDECTOMY;  Surgeon: Osborn Coho, MD;  Location: [facility];  Service: ENT;  Laterality: Bilateral;   Family History:  Family History  Problem Relation Age of Onset  Mental illness Mother    Bipolar disorder Mother    Personality disorder Mother    Alcohol abuse Mother    Drug abuse Mother    Skin cancer Maternal Grandmother    Mental illness Paternal Grandmother    Hepatitis C  Paternal Grandfather    Cirrhosis Paternal Grandfather    Heart attack Paternal Grandfather    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    Comment: works at [employer]  Tobacco Use   Smoking status: Former    Types: Cigarettes    Passive exposure: Yes   Smokeless tobacco: Never   Tobacco comments:    Dad vapes in house and car - maybe with nicotine    No nicotine use since 22yo  Vaping Use   Vaping status: Every Day   Substances: CBD  Substance and Sexual Activity   Alcohol use: Yes    Alcohol/week: 1.0 standard drink of alcohol    Types: 1 Shots of liquor per week    Comment: every few months, socially   Drug use: Not Currently    Types: Marijuana    Comment: last used 08/2022   Sexual activity: Not on file  Other Topics Concern   Not on file  Social History Narrative   Lives with dad, and sister.    He has graduated from high school   Recently quit working at [employer]: Low Risk  (03/06/2022)   Received from [employer] [facility], [facility]   Overall Financial Resource Strain (CARDIA)    Difficulty of Paying Living Expenses: Not hard at all  Recent Concern: Financial Resource Strain - Medium Risk (12/30/2021)   Received from [facility], [facility]   Overall Financial Resource Strain (CARDIA)     Difficulty of Paying Living Expenses: Somewhat hard  Food Insecurity: No Food Insecurity (12/25/2022)   Hunger Vital Sign    Worried About Running Out of Food in the Last Year: Never true    Ran Out of Food in the Last Year: Never true  Transportation Needs: No Transportation Needs (12/25/2022)   PRAPARE - Administrator, Civil Service (Medical): No    Lack of Transportation (Non-Medical): No  Physical Activity: Unknown (03/06/2022)   Received from [facility], [facility]   Exercise Vital Sign    Days of Exercise per Week: 0 days    Minutes of Exercise per Session: Not on file  Stress: Patient Declined (07/04/2022)   Received from [facility], [facility] of Occupational [facility] - Occupational Stress Questionnaire    Feeling of Stress : Patient declined  Social Connections: Socially Isolated (03/06/2022)   Received [facility]   Social Network  How would you rate your social network (family, work, friends)?: Little participation, lonely and socially isolated   Additional Social History:                           Current Medications: Current Facility-Administered Medications  Medication Dose Route Frequency Provider Last Rate Last Admin   acetaminophen (TYLENOL) tablet 650 mg  650 mg Oral Q6H PRN Chaka Boyson, Harrold Donath, MD   650 mg at 12/26/22 2101   alum & mag hydroxide-simeth (MAALOX/MYLANTA) 200-200-20 MG/5ML suspension 30 mL  30 mL Oral Q4H PRN Tinesha Siegrist, Harrold Donath, MD       desvenlafaxine (PRISTIQ) 24 hr tablet 150 mg  150 mg Oral Daily Ayub Kirsh, Harrold Donath, MD   150 mg at 12/27/22 8119   estradiol valerate (DELESTROGEN) 20 MG/ML injection 20 mg  20 mg Intramuscular Q Jamse Mead, Harrold Donath, MD       guanFACINE (INTUNIV) ER tablet 2 mg  2 mg Oral Daily Takeia Ciaravino, MD   2 mg at 12/27/22 1478   hydrOXYzine (ATARAX) tablet 25 mg  25 mg Oral TID PRN Phineas Inches, MD   25 mg at 12/26/22 2101   influenza vac  split trivalent PF (FLULAVAL) injection 0.5 mL  0.5 mL Intramuscular Tomorrow-1000 Izik Bingman, Harrold Donath, MD       magnesium hydroxide (MILK OF MAGNESIA) suspension 30 mL  30 mL Oral Daily PRN Nasiyah Laverdiere, Harrold Donath, MD       metoprolol succinate (TOPROL-XL) 24 hr tablet 12.5 mg  12.5 mg Oral Daily Sherrell Weir, MD   12.5 mg at 12/27/22 0821   montelukast (SINGULAIR) tablet 10 mg  10 mg Oral QHS Hendrick Pavich, Harrold Donath, MD   10 mg at 12/26/22 2101   traZODone (DESYREL) tablet 100 mg  100 mg Oral QHS PRN Ruven Corradi, Harrold Donath, MD   100 mg at 12/26/22 2101   triamcinolone cream (KENALOG) 0.5 % 1 Application  1 Application Topical BID Phineas Inches, MD   1 Application at 12/27/22 2956    Lab Results: No results found for this or any previous visit (from the past 48 hour(s)).  Blood Alcohol level:  Lab Results  Component Value Date   ETH <10 12/24/2022   ETH <10 05/12/2022    Metabolic Disorder Labs: Lab Results  Component Value Date   HGBA1C 5.1 08/23/2022   MPG 100 08/23/2022   MPG 105 01/13/2022   Lab Results  Component Value Date   PROLACTIN 6.4 07/09/2020   PROLACTIN 22.4 (H) 04/02/2020   Lab Results  Component Value Date   CHOL 167 08/23/2022   TRIG 171 (H) 08/23/2022   HDL 31 (L) 08/23/2022   CHOLHDL 5.4 08/23/2022   VLDL 34 08/23/2022   LDLCALC 102 (H) 08/23/2022   LDLCALC 143 (H) 01/13/2022    Physical Findings: AIMS:  , ,  ,  ,    CIWA:    COWS:       Psychiatric Specialty Exam:  Presentation  General Appearance:  Casual; Fairly Groomed  Eye Contact: Fair  Speech: Normal Rate  Speech Volume: Normal  Handedness: Right   Mood and Affect  Mood: Anxious; Depressed  Affect: Appropriate; Congruent; Full Range   Thought Process  Thought Processes: Linear  Descriptions of Associations:Intact  Orientation:Full (Time, Place and Person)  Thought Content:Logical  History of Schizophrenia/Schizoaffective disorder:No  Duration of Psychotic  Symptoms:No data recorded Hallucinations:Hallucinations: None  Ideas of Reference:None  Suicidal Thoughts:Suicidal Thoughts: No  Homicidal Thoughts:Homicidal Thoughts: No   Sensorium  Memory: Immediate Good; Recent Good; Remote Good  Judgment: Fair  Insight: Fair   Chartered certified accountant: Fair  Attention Span: Fair  Recall: Good  Fund of Knowledge: Good  Language: Good   Psychomotor Activity  Psychomotor Activity:Psychomotor Activity: Normal   Assets  Assets: Housing; Physical Health; Communication Skills   Sleep  Sleep:Sleep: Poor    Physical Exam: Physical Exam Vitals reviewed.  Constitutional:      General: She is not in acute distress.    Appearance: She is not toxic-appearing.  Pulmonary:     Effort: Pulmonary effort is normal.  Neurological:     Mental Status: She is alert.     Motor: No weakness.     Gait: Gait normal.  Psychiatric:        Behavior: Behavior normal.    Review of Systems  Constitutional:  Negative for chills and fever.  Cardiovascular:  Negative for chest pain and palpitations.  Neurological:  Negative for dizziness, tingling, tremors and headaches.  Psychiatric/Behavioral:  Positive for depression. Negative for hallucinations, memory loss, substance abuse and suicidal ideas. The patient is nervous/anxious and has insomnia.   All other systems reviewed and are negative.  Blood pressure 124/76, pulse 77, temperature 98.3 F (36.8 C), temperature source Oral, resp. rate 16, height 5\' 7"  (1.702 m), weight 86.6 kg, SpO2 100%. Body mass index is 29.91 kg/m.   Treatment Plan Summary:  Daily contact with patient to assess and evaluate symptoms and progress in treatment and Medication management   ASSESSMENT:   Diagnoses / Active Problems: MDD severe recurrent without psychosis GAD History of PTSD History of ADHD   PLAN: Safety and Monitoring:             -- Involuntary admission to inpatient  psychiatric unit for safety, stabilization and treatment             -- Daily contact with patient to assess and evaluate symptoms and progress in treatment             -- Patient's case to be discussed in multi-disciplinary team meeting             -- Observation Level : q15 minute checks             -- Vital signs:  q12 hours             -- Precautions: suicide, elopement, and assault   2. Psychiatric Diagnoses and Treatment:                 -Continue Pristiq 150 mg once daily for MDD, GAD, PTSD.  Patient reports that prior to the onset of the identifiable stressor, her mood was stable and her medications were working well   -Continue Abilify maintained at 400 mg once monthly, last dose 10/31.  Patient is consistently needed augmentation of Abilify for MDD   -Continue hydroxyzine as needed and trazodone as needed for anxiety and sleep   -Continue guanfacine 2 mg every morning for ADHD.  This is a home medication     --  The risks/benefits/side-effects/alternatives to this medication were discussed in detail with the patient and time was given for questions. The patient consents to medication trial.                -- Metabolic profile and EKG monitoring obtained while on an atypical antipsychotic (BMI: Lipid Panel: HbgA1c: QTc:)              -- Encouraged patient  to participate in unit milieu and in scheduled group therapies                        3. Medical Issues Being Addressed:    Continue metoprolol for tachycardia   4. Discharge Planning:              -- Social work and case management to assist with discharge planning and identification of hospital follow-up needs prior to discharge             -- Estimated LOS: 3-4 more days             -- Discharge Concerns: Need to establish a safety plan; Medication compliance and effectiveness             -- Discharge Goals: Return home with outpatient referrals for mental health follow-up including medication management/psychotherapy       Phineas Inches, MD 12/27/2022, 9:37 AM  Total Time Spent in Direct Patient Care:  I personally spent 25 minutes on the unit in direct patient care. The direct patient care time included face-to-face time with the patient, reviewing the patient's chart, communicating with other professionals, and coordinating care. Greater than 50% of this time was spent in counseling or coordinating care with the patient regarding goals of hospitalization, psycho-education, and discharge planning needs.   Phineas Inches, MD Psychiatrist

## 2022-12-27 NOTE — [facility] Suicide Risk Assessment (Signed)
[facility] INPATIENT:  Family/Significant Other Suicide Prevention Education  Suicide Prevention Education:  Contact Attempts: Denae, (Peer support leader [phone]) has been identified by the patient as the family member/significant other with whom the patient will be residing, and identified as the person(s) who will aid the patient in the event of a mental health crisis.  With written consent from the patient, two attempts were made to provide suicide prevention education, prior to and/or following the patient's discharge.  We were unsuccessful in providing suicide prevention education.  A suicide education pamphlet was given to the patient to share with family/significant other.  Date and time of first attempt:12/27/2022/2:45pm   Scott [name] O Orene Abbasi 12/27/2022, 2:44 PM

## 2022-12-27 NOTE — Plan of Care (Signed)
  Problem: Education: Goal: Knowledge of [facility] Education information/materials will improve Outcome: Progressing   Problem: Education: Goal: Mental status will improve Outcome: Progressing   Problem: Education: Goal: Verbalization of understanding the information provided will improve Outcome: Progressing   Problem: Activity: Goal: Sleeping patterns will improve Outcome: Progressing   Problem: Safety: Goal: Periods of time without injury will increase Outcome: Progressing

## 2022-12-28 ENCOUNTER — Encounter (HOSPITAL_COMMUNITY): Payer: Self-pay

## 2022-12-28 DIAGNOSIS — F332 Major depressive disorder, recurrent severe without psychotic features: Secondary | ICD-10-CM | POA: Diagnosis not present

## 2022-12-28 MED ORDER — PROGESTERONE 200 MG PO CAPS
200.0000 mg | ORAL_CAPSULE | Freq: Every day | ORAL | Status: DC
  Administered 2022-12-29 – 2022-12-30 (×2): 200 mg via ORAL
  Filled 2022-12-28 (×4): qty 1

## 2022-12-28 MED ORDER — DOCUSATE SODIUM 100 MG PO CAPS
100.0000 mg | ORAL_CAPSULE | Freq: Two times a day (BID) | ORAL | Status: DC
  Administered 2022-12-28 – 2022-12-30 (×4): 100 mg via ORAL
  Filled 2022-12-28 (×9): qty 1

## 2022-12-28 MED ORDER — TRAZODONE HCL 150 MG PO TABS
150.0000 mg | ORAL_TABLET | Freq: Every evening | ORAL | Status: DC | PRN
  Administered 2022-12-28 – 2022-12-29 (×2): 150 mg via ORAL
  Filled 2022-12-28 (×2): qty 1

## 2022-12-28 NOTE — Plan of Care (Signed)
  Problem: Education: Goal: Emotional status will improve Outcome: Progressing Goal: Mental status will improve Outcome: Progressing  Patient is out in the Milieu this shift very pleasant interacting well with Peers and Staff compliant with medications no adverse effects noted. Denies SI/HI/A/VH and verbally contracts for safety. Q 15 minutes safety checks ongoing. Patient remains safe.

## 2022-12-28 NOTE — [facility] Group Notes (Signed)
[facility] Group Notes:  (Nursing/MHT/Case Management/Adjunct)  Date:  12/28/2022  Time:  2000  Type of Therapy:   Wrap up group  Participation Level:  Active  Participation Quality:  Appropriate, Attentive, Sharing, and Supportive  Affect:  Depressed  Cognitive:  Alert  Insight:  Limited  Engagement in Group:  Engaged  Modes of Intervention:  Clarification, Education, and Support  Summary of Progress/Problems: Positive thinking and positive change were discussed.   Marcille Buffy 12/28/2022, 10:12 PM

## 2022-12-28 NOTE — BH IP Treatment Plan (Signed)
Interdisciplinary Treatment and Diagnostic Plan Update  12/28/2022 Time of Session: 10:50AM  Scott [name] MRN: [id number]  Principal Diagnosis: <principal problem not specified>  Secondary Diagnoses: Active Problems:   PTSD (post-traumatic stress disorder)   Borderline personality disorder (HCC)   GAD (generalized anxiety disorder)   MDD (major depressive disorder), recurrent episode, severe (HCC)   Current Medications:  Current Facility-Administered Medications  Medication Dose Route Frequency Provider Last Rate Last Admin   acetaminophen (TYLENOL) tablet 650 mg  650 mg Oral Q6H PRN Massengill, Harrold Donath, MD   650 mg at 12/28/22 1328   alum & mag hydroxide-simeth (MAALOX/MYLANTA) 200-200-20 MG/5ML suspension 30 mL  30 mL Oral Q4H PRN Massengill, Harrold Donath, MD       desvenlafaxine (PRISTIQ) 24 hr tablet 150 mg  150 mg Oral Daily Massengill, Nathan, MD   150 mg at 12/28/22 8295   docusate sodium (COLACE) capsule 100 mg  100 mg Oral BID Massengill, Harrold Donath, MD       estradiol valerate (DELESTROGEN) 20 MG/ML injection 20 mg  20 mg Intramuscular Q Jamse Mead, Harrold Donath, MD   20 mg at 12/27/22 0955   guanFACINE (INTUNIV) ER tablet 2 mg  2 mg Oral Daily Massengill, Nathan, MD   2 mg at 12/28/22 6213   hydrOXYzine (ATARAX) tablet 25 mg  25 mg Oral TID PRN Phineas Inches, MD   25 mg at 12/27/22 2114   magnesium hydroxide (MILK OF MAGNESIA) suspension 30 mL  30 mL Oral Daily PRN Phineas Inches, MD   30 mL at 12/28/22 0865   metoprolol succinate (TOPROL-XL) 24 hr tablet 12.5 mg  12.5 mg Oral Daily Massengill, Harrold Donath, MD   12.5 mg at 12/28/22 7846   montelukast (SINGULAIR) tablet 10 mg  10 mg Oral QHS Massengill, Harrold Donath, MD   10 mg at 12/27/22 2115   traZODone (DESYREL) tablet 100 mg  100 mg Oral QHS PRN Massengill, Harrold Donath, MD   100 mg at 12/27/22 2114   triamcinolone cream (KENALOG) 0.5 % 1 Application  1 Application Topical BID Massengill, Harrold Donath, MD   1 Application at 12/28/22 9629   PTA  Medications: Medications Prior to Admission  Medication Sig Dispense Refill Last Dose   ARIPiprazole ER (ABILIFY MAINTENA) 400 MG PRSY prefilled syringe Inject 400 mg into the muscle every 28 (twenty-eight) days. 1 each 2    desvenlafaxine (PRISTIQ) 50 MG 24 hr tablet Take 1 tablet (50 mg total) by mouth every morning for 14 days, THEN 2 tablets (100 mg total) every morning. (Patient taking differently: Take 150 mg by mouth once a day) 134 tablet 0    estradiol valerate (DELESTROGEN) 20 MG/ML injection Inject 20 mg into the muscle every Sunday.      guanFACINE (INTUNIV) 2 MG TB24 ER tablet Take 1 tablet (2 mg total) by mouth daily. 30 tablet 2    hydrOXYzine (ATARAX) 25 MG tablet Take 1 tablet (25 mg total) by mouth daily as needed for anxiety. 30 tablet 2    levocetirizine (XYZAL) 5 MG tablet Take 5 mg by mouth at bedtime.      metoprolol succinate (TOPROL-XL) 25 MG 24 hr tablet Take 12.5 mg by mouth daily.      montelukast (SINGULAIR) 10 MG tablet Take 10 mg by mouth at bedtime.      progesterone (PROMETRIUM) 100 MG capsule Take 1 capsule (100 mg total) by mouth daily. (Patient not taking: Reported on 12/25/2022) 30 capsule 3    progesterone (PROMETRIUM) 200 MG capsule Take 200 mg  by mouth daily.      traZODone (DESYREL) 100 MG tablet Take 100 mg by mouth at bedtime as needed for sleep.      triamcinolone ointment (KENALOG) 0.5 % Apply 1 application. topically 2 (two) times daily. (Patient taking differently: Apply 1 application  topically 2 (two) times daily as needed (for irritation).) 60 g 3     Patient Stressors: Marital or family conflict   Occupational concerns   Traumatic event    Patient Strengths: General fund of knowledge  Motivation for treatment/growth  Physical Health   Treatment Modalities: Medication Management, Group therapy, Case management,  1 to 1 session with clinician, Psychoeducation, Recreational therapy.   Physician Treatment Plan for Primary Diagnosis:  <principal problem not specified> Long Term Goal(s): Improvement in symptoms so as ready for discharge   Short Term Goals: Ability to identify changes in lifestyle to reduce recurrence of condition will improve Ability to verbalize feelings will improve Ability to disclose and discuss suicidal ideas Ability to demonstrate self-control will improve Ability to identify and develop effective coping behaviors will improve Ability to maintain clinical measurements within normal limits will improve Compliance with prescribed medications will improve Ability to identify triggers associated with substance abuse/mental health issues will improve  Medication Management: Evaluate patient's response, side effects, and tolerance of medication regimen.  Therapeutic Interventions: 1 to 1 sessions, Unit Group sessions and Medication administration.  Evaluation of Outcomes: Not Progressing  Physician Treatment Plan for Secondary Diagnosis: Active Problems:   PTSD (post-traumatic stress disorder)   Borderline personality disorder (HCC)   GAD (generalized anxiety disorder)   MDD (major depressive disorder), recurrent episode, severe (HCC)  Long Term Goal(s): Improvement in symptoms so as ready for discharge   Short Term Goals: Ability to identify changes in lifestyle to reduce recurrence of condition will improve Ability to verbalize feelings will improve Ability to disclose and discuss suicidal ideas Ability to demonstrate self-control will improve Ability to identify and develop effective coping behaviors will improve Ability to maintain clinical measurements within normal limits will improve Compliance with prescribed medications will improve Ability to identify triggers associated with substance abuse/mental health issues will improve     Medication Management: Evaluate patient's response, side effects, and tolerance of medication regimen.  Therapeutic Interventions: 1 to 1 sessions, Unit Group  sessions and Medication administration.  Evaluation of Outcomes: Not Progressing   RN Treatment Plan for Primary Diagnosis: <principal problem not specified> Long Term Goal(s): Knowledge of disease and therapeutic regimen to maintain health will improve  Short Term Goals: Ability to remain free from injury will improve, Ability to verbalize frustration and anger appropriately will improve, Ability to demonstrate self-control, Ability to participate in decision making will improve, Ability to verbalize feelings will improve, Ability to disclose and discuss suicidal ideas, Ability to identify and develop effective coping behaviors will improve, and Compliance with prescribed medications will improve  Medication Management: RN will administer medications as ordered by provider, will assess and evaluate patient's response and provide education to patient for prescribed medication. RN will report any adverse and/or side effects to prescribing provider.  Therapeutic Interventions: 1 on 1 counseling sessions, Psychoeducation, Medication administration, Evaluate responses to treatment, Monitor vital signs and CBGs as ordered, Perform/monitor CIWA, COWS, AIMS and Fall Risk screenings as ordered, Perform wound care treatments as ordered.  Evaluation of Outcomes: Not Progressing   LCSW Treatment Plan for Primary Diagnosis: <principal problem not specified> Long Term Goal(s): Safe transition to appropriate next level of care at discharge,  Engage patient in therapeutic group addressing interpersonal concerns.  Short Term Goals: Engage patient in aftercare planning with referrals and resources, Increase social support, Increase ability to appropriately verbalize feelings, Increase emotional regulation, Facilitate acceptance of mental health diagnosis and concerns, Facilitate patient progression through stages of change regarding substance use diagnoses and concerns, Identify triggers associated with mental  health/substance abuse issues, and Increase skills for wellness and recovery  Therapeutic Interventions: Assess for all discharge needs, 1 to 1 time with Social worker, Explore available resources and support systems, Assess for adequacy in community support network, Educate family and significant other(s) on suicide prevention, Complete Psychosocial Assessment, Interpersonal group therapy.  Evaluation of Outcomes: Not Progressing   Progress in Treatment: Attending groups: Yes. Participating in groups: Yes. Taking medication as prescribed: Yes. Toleration medication: Yes. Family/Significant other contact made: No, will contact:  Denae (peer support  [phone]) Patient understands diagnosis: Yes. Discussing patient identified problems/goals with staff: Yes. Medical problems stabilized or resolved: Yes. Denies suicidal/homicidal ideation: Yes. Issues/concerns per patient self-inventory: No.   New problem(s) identified: No, Describe:  none reported  New Short Term/Long Term Goal(s): medication stabilization, elimination of SI thoughts, development of comprehensive mental wellness plan.    Patient Goals:  "Stabilization of meds and go to group therapy"  Discharge Plan or Barriers: Patient recently admitted. CSW will continue to follow and assess for appropriate referrals and possible discharge planning.    Reason for Continuation of Hospitalization: Anxiety Depression Medication stabilization Suicidal ideation  Estimated Length of Stay: 5-7 days  Last 3 [place] Suicide Severity Risk Score: Flowsheet Row Ad[facility] (Current) [facility] from 12/24/2022 in [facility] ED from 11/07/2022 in [facility]  C-SSRS RISK CATEGORY High Risk Error: Q2 is Yes, you must answer 3, 4, and 5 Moderate Risk       Last PHQ 2/9 Scores:    01/13/2022    6:24 PM  08/29/2021   10:15 AM 07/22/2021    2:48 PM  Depression screen PHQ 2/9  Decreased Interest 3 2 1   Down, Depressed, Hopeless 3 1 2   PHQ - 2 Score 6 3 3   Altered sleeping 3 1 2   Tired, decreased energy 3 1 1   Change in appetite 3 3 2   Feeling bad or failure about yourself  3 3 3   Trouble concentrating 3 3 2   Moving slowly or fidgety/restless 0 3 0  Suicidal thoughts 3 0   PHQ-9 Score 24 17 13   Difficult doing work/chores Extremely dIfficult Extremely dIfficult     Scribe for Treatment Team: Kathi Der, LCSWA 12/28/2022 1:30 PM

## 2022-12-28 NOTE — Group Note (Signed)
Recreation Therapy Group Note   Group Topic:Stress Management  Group Date: 12/28/2022 Start Time: 0940 End Time: 1010 Facilitators: Izael Bessinger-McCall, LRT,CTRS Location: [address] Dayroom   Group Topic: Stress Management  Goal Area(s) Addresses:  Patient will identify positive stress management techniques. Patient will identify benefits of using stress management post d/c.  Group Description: Meditation. LRT played a meditation for patients that focused on patience. The meditation also led patients through a body scan to get in tune with any sensations they may be feeling.   Education:  Stress Management, Discharge Planning.   Education Outcome: Acknowledges Education   Affect/Mood: Appropriate   Participation Level: Engaged   Participation Quality: Independent   Behavior: Appropriate   Speech/Thought Process: Focused   Insight: Good   Judgement: Good   Modes of Intervention: Stress Management   Patient Response to Interventions:  Engaged   Education Outcome:  In group clarification offered    Clinical Observations/Individualized Feedback: Pt attended and participated in group session.      Plan: Continue to engage patient in RT group sessions 2-3x/week.   Devann Cribb-McCall, LRT,CTRS 12/28/2022 12:29 PM

## 2022-12-28 NOTE — Group Note (Signed)
Date:  12/28/2022 Time:  10:21 AM  Group Topic/Focus:  Goals Group:   The focus of this group is to help patients establish daily goals to achieve during treatment and discuss how the patient can incorporate goal setting into their daily lives to aide in recovery. Orientation:   The focus of this group is to educate the patient on the purpose and policies of crisis stabilization and provide a format to answer questions about their admission.  The group details unit policies and expectations of patients while admitted.    Participation Level:  Active  Participation Quality:  Attentive  Affect:  Appropriate  Cognitive:  Appropriate  Insight: Appropriate  Engagement in Group:  Engaged  Modes of Intervention:  Discussion  Additional Comments:  Patient attended group today. Engaged with peers and participated in discussions. No concerns noted during the group.   Shawonda Kerce T Wylodean Shimmel 12/28/2022, 10:21 AM

## 2022-12-28 NOTE — Progress Notes (Addendum)
[name] MD Progress Note  12/28/2022 3:08 PM Scott [name]  MRN:  [id number]  Subjective:    Patient is a 22 year old male to male transgender person with a reported psychiatric history of "depression, anxiety, ADHD, and PTSD" who was admitted to this hospital after suicide attempt by overdose on progesterone. Patient was medically cleared by outside hospital and transferred to the psychiatric unit for evaluation and treatment.   Yesterday, on admission, the psychiatry team made the following recommendations: -Restart Pristiq 150 mg once daily for MDD, GAD, PTSD.  Patient reports that prior to the onset of the identifiable stressor, her mood was stable and her medications were working well -Continue Abilify maintained at 400 mg once monthly, last dose 10/31.  Patient is consistently needed augmentation of Abilify for MDD -Continue hydroxyzine as needed and trazodone as needed for anxiety and sleep -Restart guanfacine 2 mg every morning for ADHD.  This is a home medication    On assessment today, the patient reports her mood is improved and more stable.  Reports still feeling anxious, but this is less as well.  Reports that sleep is still poor, and we discussed increasing trazodone to address that and the patient is agreeable.  She reports appetite and concentration are okay.  She denies any SI today.  Denies any HI or psychotic symptoms.  She overall reports some benefit from psychiatric hospitalization.  We discussed continuing to monitor the patient on her medication regimen and plan for discharge on Wednesday, the patient is agreeable.  She is asking for PHP, and this was relayed to the Child psychotherapist.  We will also start Colace as a stool softener, patient is having daily bowel movements but the consistency is hard.     Principal Problem: <principal problem not specified> Diagnosis: Active Problems:   PTSD (post-traumatic stress disorder)   Borderline personality disorder (HCC)   GAD  (generalized anxiety disorder)   MDD (major depressive disorder), recurrent episode, severe (HCC)  Total Time spent with patient: 15 minutes  Past Psychiatric History: MDD, GAD, ADHD, PTSD. Patient reports history of multiple hospitalizations. Reports a history of 3-5 suicide attempts all by overdose. Patient reports current psychiatric medications include guanfacine, Pristiq, and Abilify Maintena, last dose was 10/31. Patient reports her psychiatric medication history also includes Zoloft.    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   History of admission to inpatient psychiatry department 10/06/2022   History of suicide attempt 01/27/2021   Suicide attempt (HCC) 01/27/2021   2022 - overdose on trazodone - wanted to die   12/22 - overdose on estradiol      Past Surgical History:  Procedure Laterality Date   ADENOIDECTOMY     EYE MUSCLE SURGERY Bilateral    x3   MYRINGOTOMY WITH TUBE PLACEMENT Bilateral    TONSILLECTOMY AND ADENOIDECTOMY Bilateral 02/03/2013   Procedure: TONSILLECTOMY AND ADENOIDECTOMY;  Surgeon: Osborn Coho, MD;  Location: [facility];  Service: ENT;  Laterality: Bilateral;   Family History:  Family History  Problem Relation Age of Onset   Mental illness Mother    Bipolar disorder Mother    Personality disorder Mother    Alcohol abuse Mother    Drug abuse Mother  Skin cancer Maternal Grandmother    Mental illness Paternal Grandmother    Hepatitis C Paternal Grandfather    Cirrhosis Paternal Grandfather    Heart attack Paternal Grandfather    Social History:  Social History   Substance and Sexual Activity  Alcohol Use Yes   Alcohol/week: 1.0 standard drink of  alcohol   Types: 1 Shots of liquor per week   Comment: every few months, socially     Social History   Substance and Sexual Activity  Drug Use Not Currently   Types: Marijuana   Comment: last used 08/2022    Social History   Socioeconomic History   Marital status: Single    Spouse name: Not on file   Number of children: Not on file   Years of education: Not on file   Highest education level: High school graduate  Occupational History   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    Comment: works at [employer]  Tobacco Use   Smoking status: Former    Types: Cigarettes    Passive exposure: Yes   Smokeless tobacco: Never   Tobacco comments:    Dad vapes in house and car - maybe with nicotine    No nicotine use since 22yo  Vaping Use   Vaping status: Every Day   Substances: CBD  Substance and Sexual Activity   Alcohol use: Yes    Alcohol/week: 1.0 standard drink of alcohol    Types: 1 Shots of liquor per week    Comment: every few months, socially   Drug use: Not Currently    Types: Marijuana    Comment: last used 08/2022   Sexual activity: Not on file  Other Topics Concern   Not on file  Social History Narrative   Lives with dad, and sister.    He has graduated from high school   Recently quit working at [employer]: Low Risk  (03/06/2022)   Received from [employer] [facility], [facility]   Overall Financial Resource Strain (CARDIA)    Difficulty of Paying Living Expenses: Not hard at all  Recent Concern: Financial Resource Strain - Medium Risk (12/30/2021)   Received from [facility], [facility]   Overall Financial Resource Strain (CARDIA)    Difficulty of Paying Living Expenses: Somewhat hard  Food Insecurity: No Food Insecurity (12/25/2022)   Hunger Vital Sign    Worried About Running Out of Food in the Last Year: Never true    Ran Out of Food in the Last  Year: Never true  Transportation Needs: No Transportation Needs (12/25/2022)   PRAPARE - Administrator, Civil Service (Medical): No    Lack of Transportation (Non-Medical): No  Physical Activity: Unknown (03/06/2022)   Received from [facility], [facility]   Exercise Vital Sign    Days of Exercise per Week: 0 days    Minutes of Exercise per Session: Not on file  Stress: Patient Declined (07/04/2022)   Received from [facility], [facility] - Occupational Stress Questionnaire    Feeling of Stress : Patient declined  Social Connections: Socially Isolated (03/06/2022)   Received from [facility], [facility]   Social Network    How would you rate your social network (family, work, friends)?: Little participation, lonely and socially isolated   Additional Social History:  Current Medications: Current Facility-Administered Medications  Medication Dose Route Frequency Provider Last Rate Last Admin   acetaminophen (TYLENOL) tablet 650 mg  650 mg Oral Q6H PRN Izik Bingman, Harrold Donath, MD   650 mg at 12/28/22 1328   alum & mag hydroxide-simeth (MAALOX/MYLANTA) 200-200-20 MG/5ML suspension 30 mL  30 mL Oral Q4H PRN Lizza Huffaker, Harrold Donath, MD       desvenlafaxine (PRISTIQ) 24 hr tablet 150 mg  150 mg Oral Daily Addie Alonge, MD   150 mg at 12/28/22 4401   docusate sodium (COLACE) capsule 100 mg  100 mg Oral BID Len Kluver, Harrold Donath, MD       estradiol valerate (DELESTROGEN) 20 MG/ML injection 20 mg  20 mg Intramuscular Q Jamse Mead, Harrold Donath, MD   20 mg at 12/27/22 0955   guanFACINE (INTUNIV) ER tablet 2 mg  2 mg Oral Daily Hayslee Casebolt, MD   2 mg at 12/28/22 0272   hydrOXYzine (ATARAX) tablet 25 mg  25 mg Oral TID PRN Phineas Inches, MD   25 mg at 12/27/22 2114   magnesium hydroxide (MILK OF MAGNESIA) suspension 30 mL  30 mL Oral Daily PRN Phineas Inches, MD   30 mL at 12/28/22 5366    metoprolol succinate (TOPROL-XL) 24 hr tablet 12.5 mg  12.5 mg Oral Daily Sapir Lavey, Harrold Donath, MD   12.5 mg at 12/28/22 4403   montelukast (SINGULAIR) tablet 10 mg  10 mg Oral QHS Taylen Wendland, Harrold Donath, MD   10 mg at 12/27/22 2115   traZODone (DESYREL) tablet 150 mg  150 mg Oral QHS PRN Allecia Bells, Harrold Donath, MD       triamcinolone cream (KENALOG) 0.5 % 1 Application  1 Application Topical BID Phineas Inches, MD   1 Application at 12/28/22 4742    Lab Results: No results found for this or any previous visit (from the past 48 hour(s)).  Blood Alcohol level:  Lab Results  Component Value Date   ETH <10 12/24/2022   ETH <10 05/12/2022    Metabolic Disorder Labs: Lab Results  Component Value Date   HGBA1C 5.1 08/23/2022   MPG 100 08/23/2022   MPG 105 01/13/2022   Lab Results  Component Value Date   PROLACTIN 6.4 07/09/2020   PROLACTIN 22.4 (H) 04/02/2020   Lab Results  Component Value Date   CHOL 167 08/23/2022   TRIG 171 (H) 08/23/2022   HDL 31 (L) 08/23/2022   CHOLHDL 5.4 08/23/2022   VLDL 34 08/23/2022   LDLCALC 102 (H) 08/23/2022   LDLCALC 143 (H) 01/13/2022    Physical Findings: AIMS:  , ,  ,  ,    CIWA:    COWS:       Psychiatric Specialty Exam:  Presentation  General Appearance:  Fairly Groomed  Eye Contact: Fair  Speech: Normal Rate  Speech Volume: Normal  Handedness: Right   Mood and Affect  Mood: Anxious  Affect: Constricted   Thought Process  Thought Processes: Linear  Descriptions of Associations:Intact  Orientation:Full (Time, Place and Person)  Thought Content:Logical  History of Schizophrenia/Schizoaffective disorder:No  Duration of Psychotic Symptoms:No data recorded Hallucinations:Hallucinations: None  Ideas of Reference:None  Suicidal Thoughts:Suicidal Thoughts: No  Homicidal Thoughts:Homicidal Thoughts: No   Sensorium  Memory: Immediate Good; Recent Good; Remote  Good  Judgment: Intact  Insight: Present   Executive Functions  Concentration: Fair  Attention Span: Fair  Recall: Good  Fund of Knowledge: Good  Language: Good   Psychomotor Activity  Psychomotor Activity:Psychomotor Activity: Normal   Assets  Assets: Housing; Physical Health;  Communication Skills   Sleep  Sleep:Sleep: Fair    Physical Exam: Physical Exam Vitals reviewed.  Constitutional:      General: She is not in acute distress.    Appearance: She is not toxic-appearing.  Pulmonary:     Effort: Pulmonary effort is normal.  Neurological:     Mental Status: She is alert.     Motor: No weakness.     Gait: Gait normal.  Psychiatric:        Behavior: Behavior normal.    Review of Systems  Constitutional:  Negative for chills and fever.  Cardiovascular:  Negative for chest pain and palpitations.  Neurological:  Negative for dizziness, tingling, tremors and headaches.  Psychiatric/Behavioral:  Positive for depression. Negative for hallucinations, memory loss, substance abuse and suicidal ideas. The patient is nervous/anxious and has insomnia.   All other systems reviewed and are negative.  Blood pressure 107/69, pulse (!) 104, temperature 97.7 F (36.5 C), resp. rate 18, height 5\' 7"  (1.702 m), weight 86.6 kg, SpO2 100%. Body mass index is 29.91 kg/m.   Treatment Plan Summary:  Daily contact with patient to assess and evaluate symptoms and progress in treatment and Medication management   ASSESSMENT:   Diagnoses / Active Problems: MDD severe recurrent without psychosis GAD History of PTSD History of ADHD   PLAN: Safety and Monitoring:             -- Involuntary admission to inpatient psychiatric unit for safety, stabilization and treatment             -- Daily contact with patient to assess and evaluate symptoms and progress in treatment             -- Patient's case to be discussed in multi-disciplinary team meeting             --  Observation Level : q15 minute checks             -- Vital signs:  q12 hours             -- Precautions: suicide, elopement, and assault   2. Psychiatric Diagnoses and Treatment:                 -Continue Pristiq 150 mg once daily for MDD, GAD, PTSD.  Patient reports that prior to the onset of the identifiable stressor, her mood was stable and her medications were working well   -Continue Abilify maintena 400 mg once monthly, last dose 10/31.  Patient is consistently needed augmentation of Abilify for MDD   -Increase trazodone from 100 mg to 150 mg nightly for insomnia   -Continue guanfacine 2 mg every morning for ADHD.  This is a home medication     --  The risks/benefits/side-effects/alternatives to this medication were discussed in detail with the patient and time was given for questions. The patient consents to medication trial.                -- Metabolic profile and EKG monitoring obtained while on an atypical antipsychotic (BMI: Lipid Panel: HbgA1c: QTc:)              -- Encouraged patient to participate in unit milieu and in scheduled group therapies                        3. Medical Issues Being Addressed:    Continue metoprolol for tachycardia Restart progesterone on Tuesday 11/12 Continue weekly estrogen IM  4. Discharge Planning:              -- Social work and case management to assist with discharge planning and identification of hospital follow-up needs prior to discharge             -- Estimated LOS: 1-2 more days             -- Discharge Concerns: Need to establish a safety plan; Medication compliance and effectiveness             -- Discharge Goals: Return home with outpatient referrals for mental health follow-up including medication management/psychotherapy      Phineas Inches, MD 12/28/2022, 3:08 PM  Total Time Spent in Direct Patient Care:  I personally spent 35 minutes on the unit in direct patient care. The direct patient care time included  face-to-face time with the patient, reviewing the patient's chart, communicating with other professionals, and coordinating care. Greater than 50% of this time was spent in counseling or coordinating care with the patient regarding goals of hospitalization, psycho-education, and discharge planning needs.   Phineas Inches, MD Psychiatrist

## 2022-12-28 NOTE — Progress Notes (Signed)
   12/28/22 1300  Psych Admission Type (Psych Patients Only)  Admission Status Involuntary  Psychosocial Assessment  Patient Complaints Depression;Anxiety  Eye Contact Fair  Facial Expression Animated  Affect Appropriate to circumstance  Speech Logical/coherent  Interaction Assertive  Motor Activity Slow  Appearance/Hygiene Unremarkable  Behavior Characteristics Cooperative;Appropriate to situation  Mood Pleasant  Thought Process  Coherency WDL  Content WDL  Delusions None reported or observed  Perception WDL  Hallucination None reported or observed  Judgment Impaired  Confusion None  Danger to Self  Current suicidal ideation? Denies  Agreement Not to Harm Self Yes  Description of Agreement verbal  Danger to Others  Danger to Others None reported or observed

## 2022-12-28 NOTE — Plan of Care (Signed)
  Problem: Education: Goal: Emotional status will improve Outcome: Progressing Goal: Mental status will improve Outcome: Progressing Goal: Verbalization of understanding the information provided will improve Outcome: Progressing   Problem: Activity: Goal: Interest or engagement in activities will improve Outcome: Progressing Goal: Sleeping patterns will improve Outcome: Progressing   Problem: Coping: Goal: Ability to verbalize frustrations and anger appropriately will improve Outcome: Progressing Goal: Ability to demonstrate self-control will improve Outcome: Progressing   Problem: Health Behavior/Discharge Planning: Goal: Identification of resources available to assist in meeting health care needs will improve Outcome: Progressing Goal: Compliance with treatment plan for underlying cause of condition will improve Outcome: Progressing   Problem: Physical Regulation: Goal: Ability to maintain clinical measurements within normal limits will improve Outcome: Progressing

## 2022-12-28 NOTE — Group Note (Signed)
Occupational Therapy Group Note  Group Topic:Coping Skills  Group Date: 12/28/2022 Start Time: 1430 End Time: 1508 Facilitators: Ted Mcalpine, OT   Group Description: Group encouraged increased engagement and participation through discussion and activity focused on "Coping Ahead." Patients were split up into teams and selected a card from a stack of positive coping strategies. Patients were instructed to act out/charade the coping skill for other peers to guess and receive points for their team. Discussion followed with a focus on identifying additional positive coping strategies and patients shared how they were going to cope ahead over the weekend while continuing hospitalization stay.  Therapeutic Goal(s): Identify positive vs negative coping strategies. Identify coping skills to be used during hospitalization vs coping skills outside of hospital/at home Increase participation in therapeutic group environment and promote engagement in treatment   Participation Level: Engaged   Participation Quality: Independent   Behavior: Appropriate   Speech/Thought Process: Relevant   Affect/Mood: Appropriate   Insight: Fair   Judgement: Fair      Modes of Intervention: Education  Patient Response to Interventions:  Attentive   Plan: Continue to engage patient in OT groups 2 - 3x/week.  12/28/2022  Ted Mcalpine, OT Kerrin Champagne, OT

## 2022-12-29 DIAGNOSIS — F332 Major depressive disorder, recurrent severe without psychotic features: Secondary | ICD-10-CM | POA: Diagnosis not present

## 2022-12-29 MED ORDER — SALINE SPRAY 0.65 % NA SOLN
1.0000 | NASAL | Status: DC | PRN
  Administered 2022-12-29 – 2022-12-30 (×2): 1 via NASAL
  Filled 2022-12-29: qty 44

## 2022-12-29 NOTE — Group Note (Signed)
Recreation Therapy Group Note   Group Topic:Animal Assisted Therapy   Group Date: 12/29/2022 Start Time: 0955 End Time: 1030 Facilitators: Rayelle Armor-McCall, LRT,CTRS Location: [address] Dayroom   Animal-Assisted Activity (AAA) Program Checklist/Progress Notes Patient Eligibility Criteria Checklist & Daily Group note for Rec Tx Intervention  AAA/T Program Assumption of Risk Form signed by Patient/ or Parent Legal Guardian Yes  Patient is free of allergies or severe asthma Yes  Patient reports no fear of animals Yes  Patient reports no history of cruelty to animals Yes  Patient understands his/her participation is voluntary Yes  Patient washes hands before animal contact Yes  Patient washes hands after animal contact Yes  Education: Hand Washing, Appropriate Animal Interaction   Education Outcome: Acknowledges education.    Affect/Mood: Appropriate   Participation Level: Engaged   Participation Quality: Independent   Behavior: Appropriate   Speech/Thought Process: Focused   Insight: Good   Judgement: Good   Modes of Intervention: Teaching laboratory technician   Patient Response to Interventions:  Engaged   Education Outcome:  In group clarification offered    Clinical Observations/Individualized Feedback:  Patient attended session and interacted appropriately with therapy dog and peers. Patient asked appropriate questions about therapy dog and his training. Patient shared stories about their pets at home with group.    Plan: Continue to engage patient in RT group sessions 2-3x/week.   Scott [name], LRT,CTRS 12/29/2022 1:56 PM

## 2022-12-29 NOTE — Progress Notes (Signed)
   12/29/22 2200  Psych Admission Type (Psych Patients Only)  Admission Status Involuntary  Psychosocial Assessment  Patient Complaints None  Eye Contact Fair  Facial Expression Animated  Affect Anxious  Speech Logical/coherent  Interaction Assertive  Motor Activity Slow  Appearance/Hygiene Unremarkable  Behavior Characteristics Cooperative;Appropriate to situation  Mood Anxious  Thought Process  Coherency WDL  Content WDL  Delusions None reported or observed  Perception WDL  Hallucination None reported or observed  Judgment Impaired  Confusion None  Danger to Self  Current suicidal ideation? Denies  Description of Suicide Plan No plan  Self-Injurious Behavior No self-injurious ideation or behavior indicators observed or expressed   Agreement Not to Harm Self Yes  Description of Agreement Verbal  Danger to Others  Danger to Others None reported or observed

## 2022-12-29 NOTE — [facility] Suicide Risk Assessment (Signed)
[facility] INPATIENT:  Family/Significant Other Suicide Prevention Education  Suicide Prevention Education:  Education Completed; Berle Mull (peer support ) [phone] ,  (name of family member/significant other) has been identified by the patient as the family member/significant other with whom the patient will be residing, and identified as the person(s) who will aid the patient in the event of a mental health crisis (suicidal ideations/suicide attempt).  With written consent from the patient, the family member/significant other has been provided the following suicide prevention education, prior to the and/or following the discharge of the patient.  Peer support shared that it is a lot of family dynamics, patient is suppose to go stay with a friend once she is DC and then make it back to her dad house. Peer support shared that patient dad told her that patient does not keep up with hygiene, does not shower or clean up after herself and he is fed up with patient. Peer support did say that the reason for this admission is because of the election and that patient was scared that she would have to stop her transition from male to male and loose her resources. Peer support has only known patient for a month and a half.   The suicide prevention education provided includes the following: Suicide risk factors Suicide prevention and interventions National Suicide Hotline telephone number [facility] assessment telephone number [facility] Emergency Assistance 911 [facility] and/or Residential Mobile Crisis Unit telephone number  Request made of family/significant other to: Remove weapons (e.g., guns, rifles, knives), all items previously/currently identified as safety concern.   Remove drugs/medications (over-the-counter, prescriptions, illicit drugs), all items previously/currently identified as a safety concern.  The family member/significant other verbalizes understanding of the suicide  prevention education information provided.  The family member/significant other agrees to remove the items of safety concern listed above.  Isabella Bowens 12/29/2022, 3:45 PM

## 2022-12-29 NOTE — Group Note (Signed)
LCSW Group Therapy Note  Group Date: 12/29/2022 Start Time: 1100 End Time: 1200   Type of Therapy and Topic:  Group Therapy - Healthy vs Unhealthy Coping Skills  Participation Level:  Minimal   Description of Group The focus of this group was to determine what unhealthy coping techniques typically are used by group members and what healthy coping techniques would be helpful in coping with various problems. Patients were guided in becoming aware of the differences between healthy and unhealthy coping techniques. Patients were asked to identify 2-3 healthy coping skills they would like to learn to use more effectively.  Therapeutic Goals Patients learned that coping is what human beings do all day long to deal with various situations in their lives Patients defined and discussed healthy vs unhealthy coping techniques Patients identified their preferred coping techniques and identified whether these were healthy or unhealthy Patients determined 2-3 healthy coping skills they would like to become more familiar with and use more often. Patients provided support and ideas to each other   Summary of Patient Progress:  Pt stayed in group for some parts of the session, after group was over pt apologized to CSW stating "I was overstimulated and had to take a break." While in group, pt provided minimal input into conversation but was respectful of peers sharing when present.   Therapeutic Modalities Cognitive Behavioral Therapy Motivational Interviewing  Kathi Der, LCSWA 12/29/2022  1:07 PM

## 2022-12-29 NOTE — [facility] Group Notes (Signed)
Group Topic/Focus:  Goals Group:   The focus of this group is to help patients establish daily goals to achieve during treatment and discuss how the patient can incorporate goal setting into their daily lives to aide in recovery.       Participation Level:  Active   Participation Quality:  Attentive   Affect:  Appropriate   Cognitive:  Appropriate   Insight: Appropriate   Engagement in Group:  Engaged   Modes of Intervention:  Discussion   Additional Comments:   Patient attended goals group and was attentive the duration of it. Patient's goal was to talk to doctor about discharge.

## 2022-12-29 NOTE — Progress Notes (Addendum)
Pt denied SI/HI/AVH this morning. Pt rated her depression a 0/10, anxiety a 0/10, and feelings of hopelessness a 0/10. Pt reports that she slept "good" last night. Pt complains of left hip pain and rates it a 5/10, PRN Tylenol administered per [facility] for pain. Pt complains of dry nose and sinuses and requests flonase, provider made aware. Pt has been pleasant, calm, and cooperative throughout the shift. Pt given scheduled medications as prescribed. Q15 min checks verified for safety. Patient verbally contracts for safety. Patient compliant with medications and treatment plan. Patient is interacting well on the unit. Pt is safe on the unit.   12/29/22 0800  Psych Admission Type (Psych Patients Only)  Admission Status Involuntary  Psychosocial Assessment  Patient Complaints None  Eye Contact Fair  Facial Expression Animated  Affect Anxious  Speech Logical/coherent  Interaction Assertive  Motor Activity Slow  Appearance/Hygiene Unremarkable  Behavior Characteristics Cooperative;Appropriate to situation  Mood Pleasant  Thought Process  Coherency WDL  Content WDL  Delusions None reported or observed  Perception WDL  Hallucination None reported or observed  Judgment Impaired  Confusion None  Danger to Self  Current suicidal ideation? Denies  Description of Suicide Plan No plan  Self-Injurious Behavior No self-injurious ideation or behavior indicators observed or expressed   Agreement Not to Harm Self Yes  Description of Agreement Verbal  Danger to Others  Danger to Others None reported or observed

## 2022-12-29 NOTE — Progress Notes (Signed)
[name] Progress Note  12/29/2022 10:48 AM Scott [name]  MRN:  [id number]  Subjective:    Patient is a 22 year old male to male transgender person with a reported psychiatric history of "depression, anxiety, ADHD, and PTSD" who was admitted to this hospital after suicide attempt by overdose on progesterone. Patient was medically cleared by outside hospital and transferred to the psychiatric unit for evaluation and treatment.   Per chart review patient is compliant with her medications on the unit, as needed trazodone 150 mg was used last night, as needed Atarax used since admission average once a day.  Patient was reported to have good sleep last night.    On assessment today, patient was evaluated on the unit she reports admission was secondary to "I was in a state of panic I tried to commit suicide" she admits to intention to kill herself when overdose on medications but she regrets her suicide attempt since admission and denies any passive or active SI since admission she reports she realize she has a lot of support and hopeful for a better living situation after discharge but plans to go to her father directly after discharge temporarily.  She denies HI or AVH, she reports improved depression, improved sleep and appetite.  Attends groups and reports benefiting from gaining coping skills with stressors.  She denies side effect of medications, she agrees to comply with medications and follow-up appointment after discharge for medication management and counseling.    The patient reports her mood is improved and more stable.  Reports still feeling anxious, but this is less as well.  Reports that sleep is still poor, and we discussed increasing trazodone to address that and the patient is agreeable.  She reports appetite and concentration are okay.  She denies any SI today.  Denies any HI or psychotic symptoms.  She overall reports some benefit from psychiatric hospitalization.  We discussed  continuing to monitor the patient on her medication regimen and plan for discharge on Wednesday, the patient is agreeable.  She is asking for PHP, and this was relayed to the Child psychotherapist.  We will also start Colace as a stool softener, patient is having daily bowel movements but the consistency is hard.     Principal Problem: <principal problem not specified> Diagnosis: Active Problems:   PTSD (post-traumatic stress disorder)   Borderline personality disorder (HCC)   GAD (generalized anxiety disorder)   MDD (major depressive disorder), recurrent episode, severe (HCC)  Total Time spent with patient: 35 minutes  Past Psychiatric History: MDD, GAD, ADHD, PTSD. Patient reports history of multiple hospitalizations. Reports a history of 3-5 suicide attempts all by overdose. Patient reports current psychiatric medications include guanfacine, Pristiq, and Abilify Maintena, last dose was 10/31. Patient reports her psychiatric medication history also includes Zoloft.    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   History of admission to inpatient psychiatry department 10/06/2022   History of suicide attempt 01/27/2021   Suicide attempt (HCC) 01/27/2021   2022 - overdose on trazodone - wanted to die   12/22 - overdose on estradiol      Past Surgical History:  Procedure Laterality Date   ADENOIDECTOMY     EYE MUSCLE  SURGERY Bilateral    x3   MYRINGOTOMY WITH TUBE PLACEMENT Bilateral    TONSILLECTOMY AND ADENOIDECTOMY Bilateral 02/03/2013   Procedure: TONSILLECTOMY AND ADENOIDECTOMY;  Surgeon: Osborn Coho, MD;  Location: [facility];  Service: ENT;  Laterality: Bilateral;   Family History:  Family  History  Problem Relation Age of Onset   Mental illness Mother    Bipolar disorder Mother    Personality disorder Mother    Alcohol abuse Mother    Drug abuse Mother    Skin cancer Maternal Grandmother    Mental illness Paternal Grandmother    Hepatitis C Paternal Grandfather    Cirrhosis Paternal Grandfather    Heart attack Paternal Grandfather    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    Comment: works at [employer]  Tobacco Use   Smoking status: Former    Types: Cigarettes    Passive exposure: Yes   Smokeless tobacco: Never   Tobacco comments:    Dad vapes in house and car - maybe with nicotine    No nicotine use since 22yo  Vaping Use   Vaping status: Every Day   Substances: CBD  Substance and Sexual Activity   Alcohol use: Yes    Alcohol/week: 1.0 standard drink of alcohol    Types: 1 Shots of liquor per week    Comment: every few months, socially   Drug use: Not Currently    Types: Marijuana    Comment: last used 08/2022   Sexual activity: Not on file  Other Topics Concern   Not on file  Social History Narrative   Lives with dad, and sister.    He has graduated from high school   Recently quit working at [employer]: Low Risk  (03/06/2022)   Received from [employer], [facility]   Overall Financial Resource Strain  (CARDIA)    Difficulty of Paying Living Expenses: Not hard at all  Recent Concern: Financial Resource Strain - Medium Risk (12/30/2021)   Received from [facility], [facility]   Overall Financial Resource Strain (CARDIA)    Difficulty of Paying Living Expenses: Somewhat hard  Food Insecurity: No Food Insecurity (12/25/2022)   Hunger Vital Sign    Worried About Running Out of Food in the Last Year: Never true    Ran Out of Food in the Last Year: Never true  Transportation Needs: No Transportation Needs (12/25/2022)   PRAPARE - Administrator, Civil Service (Medical): No    Lack of Transportation (Non-Medical): No  Physical Activity: Unknown (03/06/2022)   Received from [facility], [facility]   Exercise Vital Sign    Days of Exercise per Week: 0 days    Minutes of  Exercise per Session: Not on file  Stress: Patient Declined (07/04/2022)   Received from [facility], [facility] - Occupational Stress Questionnaire    Feeling of Stress : Patient declined  Social Connections: Socially Isolated (03/06/2022)   Received from [facility], [facility]   Social Network    How would you rate your social network (family, work, friends)?: Little participation, lonely and socially isolated   Additional Social History:                           Current Medications: Current Facility-Administered Medications  Medication Dose Route Frequency Provider Last Rate Last Admin   acetaminophen (TYLENOL) tablet 650 mg  650 mg Oral Q6H PRN Massengill, Harrold Donath, MD   650 mg at 12/29/22 0727   alum & mag hydroxide-simeth (MAALOX/MYLANTA) 200-200-20 MG/5ML suspension 30 mL  30 mL Oral Q4H PRN Massengill, Harrold Donath, MD       desvenlafaxine (PRISTIQ) 24 hr tablet 150 mg  150 mg Oral Daily Massengill, Nathan, MD   150 mg at 12/29/22 6962   docusate sodium (COLACE) capsule 100 mg  100 mg Oral BID Massengill,  Harrold Donath, MD   100 mg at 12/29/22 9528   estradiol valerate (DELESTROGEN) 20 MG/ML injection 20 mg  20 mg Intramuscular Q Jamse Mead, Harrold Donath, MD   20 mg at 12/27/22 0955   guanFACINE (INTUNIV) ER tablet 2 mg  2 mg Oral Daily Massengill, Nathan, MD   2 mg at 12/29/22 4132   hydrOXYzine (ATARAX) tablet 25 mg  25 mg Oral TID PRN Phineas Inches, MD   25 mg at 12/28/22 2110   magnesium hydroxide (MILK OF MAGNESIA) suspension 30 mL  30 mL Oral Daily PRN Phineas Inches, MD   30 mL at 12/28/22 4401   metoprolol succinate (TOPROL-XL) 24 hr tablet 12.5 mg  12.5 mg Oral Daily Massengill, Harrold Donath, MD   12.5 mg at 12/29/22 0726   montelukast (SINGULAIR) tablet 10 mg  10 mg Oral QHS Massengill, Harrold Donath, MD   10 mg at 12/28/22 2109   progesterone (PROMETRIUM) capsule 200 mg  200 mg Oral Daily Massengill, Harrold Donath, MD   200 mg at 12/29/22 0726   traZODone (DESYREL) tablet 150 mg  150 mg Oral QHS PRN Phineas Inches, MD   150 mg at 12/28/22 2110   triamcinolone cream (KENALOG) 0.5 % 1 Application  1 Application Topical BID Phineas Inches, MD   1 Application at 12/29/22 0272    Lab Results: No results found for this or any previous visit (from the past 48 hour(s)).  Blood Alcohol level:  Lab Results  Component Value Date   ETH <10 12/24/2022   ETH <10 05/12/2022    Metabolic Disorder Labs: Lab Results  Component Value Date   HGBA1C 5.1 08/23/2022   MPG 100 08/23/2022   MPG 105 01/13/2022   Lab Results  Component Value Date   PROLACTIN 6.4 07/09/2020   PROLACTIN 22.4 (H) 04/02/2020   Lab Results  Component Value Date   CHOL 167 08/23/2022   TRIG 171 (H) 08/23/2022   HDL 31 (L) 08/23/2022   CHOLHDL 5.4 08/23/2022   VLDL 34 08/23/2022   LDLCALC 102 (H) 08/23/2022   LDLCALC 143 (H) 01/13/2022    Physical Findings: AIMS:  , ,  ,  ,    CIWA:    COWS:       Psychiatric Specialty Exam:  Presentation  General Appearance:  Fairly Groomed  Eye  Contact: Fair  Speech: Normal Rate  Speech Volume: Normal  Handedness: Right   Mood and Affect  Mood: Euthymic, bright and cheerful  Affect: Congruent   Thought Process  Thought Processes: Linear  Descriptions of Associations:Intact  Orientation:Full (Time, Place and Person)  Thought Content:Logical  History of Schizophrenia/Schizoaffective disorder:No  Duration of Psychotic Symptoms:No data recorded Hallucinations:Hallucinations: None  Ideas of Reference:None  Suicidal Thoughts:Suicidal Thoughts: No  Homicidal Thoughts:No data recorded   Sensorium  Memory: Immediate Good; Recent Good; Remote Good  Judgment: Intact  Insight: Present   Executive Functions  Concentration: Fair  Attention Span: Fair  Recall: Good  Fund of Knowledge: Good  Language: Good   Psychomotor Activity  Psychomotor Activity:Psychomotor Activity: Normal   Assets  Assets: Housing; Physical Health; Communication Skills   Sleep  Sleep:Sleep: Fair    Physical Exam: Physical Exam Vitals reviewed.  Constitutional:      General: She is not in acute distress.    Appearance: She is not toxic-appearing.  Pulmonary:     Effort: Pulmonary effort is normal.  Neurological:     Mental Status: She is alert.     Motor: No weakness.     Gait: Gait normal.  Psychiatric:        Behavior: Behavior normal.    Review of Systems  Constitutional:  Negative for chills and fever.  Cardiovascular:  Negative for chest pain and palpitations.  Neurological:  Negative for dizziness, tingling, tremors and headaches.  Psychiatric/Behavioral:  Positive for depression. Negative for hallucinations, memory loss, substance abuse and suicidal ideas. The patient is nervous/anxious and has insomnia.   All other systems reviewed and are negative.  Blood pressure 93/77, pulse (!) 111, temperature 98 F (36.7 C), temperature source Oral, resp. rate 16, height 5\' 7"  (1.702 m), weight  86.6 kg, SpO2 99%. Body mass index is 29.91 kg/m.   Treatment Plan Summary:  Daily contact with patient to assess and evaluate symptoms and progress in treatment and Medication management   ASSESSMENT:   Diagnoses / Active Problems: MDD severe recurrent without psychosis GAD History of PTSD History of ADHD   PLAN: Safety and Monitoring:             -- Involuntary admission to inpatient psychiatric unit for safety, stabilization and treatment             -- Daily contact with patient to assess and evaluate symptoms and progress in treatment             -- Patient's case to be discussed in multi-disciplinary team meeting             -- Observation Level : q15 minute checks             -- Vital signs:  q12 hours             -- Precautions: suicide, elopement, and assault   2. Psychiatric Diagnoses and Treatment:                 -Continue Pristiq 150 mg once daily for MDD, GAD, PTSD.  Patient reports that prior to the onset of the identifiable stressor, her mood was stable and her medications were working well   -Continue Abilify maintena 400 mg once monthly, last dose 10/31.  Patient is consistently needed augmentation of Abilify for MDD   -Continue trazodone 150 mg nightly for insomnia   -Continue guanfacine 2 mg every morning for ADHD.  This is a  home medication     --  The risks/benefits/side-effects/alternatives to this medication were discussed in detail with the patient and time was given for questions. The patient consents to medication trial.                -- Metabolic profile and EKG monitoring obtained while on an atypical antipsychotic (BMI: Lipid Panel: HbgA1c: QTc:)              -- Encouraged patient to participate in unit milieu and in scheduled group therapies                        3. Medical Issues Being Addressed:    Continue metoprolol for tachycardia Restart progesterone on Tuesday 11/12 Continue weekly estrogen IM   4. Discharge Planning:               -- Social work and case management to assist with discharge planning and identification of hospital follow-up needs prior to discharge             -- Estimated LOS: 1-2 more days             -- Discharge Concerns: Need to establish a safety plan; Medication compliance and effectiveness             -- Discharge Goals: Return home with outpatient referrals for mental health follow-up including medication management/psychotherapy      Maxim Bedel Abbott Pao, MD 12/29/2022, 10:48 AM  Total Time Spent in Direct Patient Care:  I personally spent 35 minutes on the unit in direct patient care. The direct patient care time included face-to-face time with the patient, reviewing the patient's chart, communicating with other professionals, and coordinating care. Greater than 50% of this time was spent in counseling or coordinating care with the patient regarding goals of hospitalization, psycho-education, and discharge planning needs.   Fabion Gatson Abbott Pao, MD Psychiatrist

## 2022-12-29 NOTE — Plan of Care (Signed)
  Problem: Education: Goal: Emotional status will improve Outcome: Progressing Goal: Mental status will improve Outcome: Progressing   Problem: Activity: Goal: Interest or engagement in activities will improve Outcome: Progressing Goal: Sleeping patterns will improve Outcome: Progressing   Problem: Coping: Goal: Ability to verbalize frustrations and anger appropriately will improve Outcome: Progressing Goal: Ability to demonstrate self-control will improve Outcome: Progressing

## 2022-12-29 NOTE — [facility] Group Notes (Signed)
[facility] Group Notes:  (Nursing/MHT/Case Management/Adjunct)  Date:  12/29/2022  Time:  10:23 PM  Type of Therapy:   Wrap-up group  Participation Level:  Active  Participation Quality:  Appropriate  Affect:  Appropriate  Cognitive:  Appropriate  Insight:  Appropriate  Engagement in Group:  Engaged  Modes of Intervention:  Education  Summary of Progress/Problems: Pt goal D/C plans. Pt reports meeting goal. Rated day 10/10.    Noah Delaine 12/29/2022, 10:23 PM

## 2022-12-30 ENCOUNTER — Ambulatory Visit (HOSPITAL_COMMUNITY): Payer: MEDICAID

## 2022-12-30 DIAGNOSIS — F332 Major depressive disorder, recurrent severe without psychotic features: Secondary | ICD-10-CM | POA: Diagnosis not present

## 2022-12-30 MED ORDER — GUANFACINE HCL ER 2 MG PO TB24: 2.0000 mg | ORAL_TABLET | Freq: Every day | ORAL | 0 refills | Status: DC

## 2022-12-30 MED ORDER — HYDROXYZINE HCL 25 MG PO TABS: 25.0000 mg | ORAL_TABLET | Freq: Three times a day (TID) | ORAL | 0 refills | Status: DC | PRN

## 2022-12-30 MED ORDER — TRAZODONE HCL 150 MG PO TABS: 150.0000 mg | ORAL_TABLET | Freq: Every evening | ORAL | 0 refills | Status: DC | PRN

## 2022-12-30 MED ORDER — DESVENLAFAXINE SUCCINATE ER 50 MG PO TB24: 150.0000 mg | ORAL_TABLET | Freq: Every day | ORAL | 0 refills | Status: DC

## 2022-12-30 NOTE — Progress Notes (Signed)
   12/30/22 0630  15 Minute Checks  Location Dayroom  Visual Appearance Calm  Behavior Composed  Sleep (Behavioral Health Patients Only)  Calculate sleep? (Click Yes once per 24 hr at 0600 safety check) Yes  Documented sleep last 24 hours 7.25

## 2022-12-30 NOTE — Discharge Summary (Signed)
Physician Discharge Summary Note  Patient:  Scott [name] is an 22 y.o., adult MRN:  [id number] DOB:  2000-06-28 Patient phone:  [phone] (home)  Patient address:   [address] [place] [address],  Total Time spent with patient: 45 minutes  Date of Admission:  12/25/2022 Date of Discharge: 12/30/2022  Reason for Admission:  Patient is a 21 year old male to male transgender person with a reported psychiatric history of "depression, anxiety, ADHD, and PTSD" who was admitted to this hospital after suicide attempt by overdose on progesterone. Patient was medically cleared by outside hospital and transferred to the psychiatric unit for evaluation and treatment.   Principal Problem: <principal problem not specified> Discharge Diagnoses: Active Problems:   PTSD (post-traumatic stress disorder)   Borderline personality disorder (HCC)   GAD (generalized anxiety disorder)   MDD (major depressive disorder), recurrent episode, severe (HCC)   Past Psychiatric History:  MDD, GAD, ADHD, PTSD. Patient reports history of multiple hospitalizations. Reports a history of 3-5 suicide attempts all by overdose. Patient reports current psychiatric medications include guanfacine, Pristiq, and Abilify Maintena, last dose was 10/31. Patient reports her psychiatric medication history also includes Zoloft.   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   History of admission to inpatient psychiatry department 10/06/2022   History of suicide attempt 01/27/2021   Suicide attempt (HCC) 01/27/2021   2022 - overdose on trazodone - wanted to die   12/22 - overdose on estradiol      Past Surgical History:   Procedure Laterality Date   ADENOIDECTOMY     EYE MUSCLE SURGERY Bilateral    x3   MYRINGOTOMY WITH TUBE PLACEMENT Bilateral    TONSILLECTOMY AND ADENOIDECTOMY Bilateral 02/03/2013   Procedure: TONSILLECTOMY AND ADENOIDECTOMY;  Surgeon: Osborn Coho, MD;  Location: [facility];  Service: ENT;  Laterality: Bilateral;    Family History:  Family History  Problem Relation Age of Onset   Mental illness Mother    Bipolar disorder Mother    Personality disorder Mother    Alcohol abuse Mother    Drug abuse Mother    Skin cancer Maternal Grandmother    Mental illness Paternal Grandmother    Hepatitis C Paternal Grandfather    Cirrhosis Paternal Grandfather    Heart attack Paternal Grandfather    Family Psychiatric  History:  Patient reports mom is bipolar disorder. Denies any other known history of family suicide attempts.  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    Comment: works at [employer]  Tobacco Use   Smoking status: Former    Types: Cigarettes    Passive exposure: Yes   Smokeless tobacco: Never   Tobacco comments:    Dad vapes in house and car - maybe with nicotine    No nicotine use since 22yo  Vaping Use   Vaping status: Every Day   Substances: CBD  Substance and Sexual Activity   Alcohol use: Yes    Alcohol/week: 1.0 standard drink of alcohol    Types: 1 Shots of liquor per  week    Comment: every few months, socially   Drug use: Not Currently    Types: Marijuana    Comment: last used 08/2022   Sexual activity: Not on file  Other Topics Concern   Not on file  Social History Narrative   Lives with dad, and sister.    He has graduated from high school   Recently quit working at [employer]: Low Risk  (03/06/2022)   Received from [facility], [facility]   Overall Financial Resource Strain (CARDIA)    Difficulty of Paying Living Expenses: Not hard at all  Recent Concern: Financial Resource Strain - Medium Risk (12/30/2021)   Received from [facility], [facility]   Overall Financial Resource Strain (CARDIA)    Difficulty of Paying Living Expenses: Somewhat hard  Food Insecurity: No Food Insecurity (12/25/2022)   Hunger Vital Sign    Worried About Running Out of Food in the Last Year: Never true    Ran Out of Food in the Last Year: Never true  Transportation Needs: No Transportation Needs (12/25/2022)   PRAPARE - Administrator, Civil Service (Medical): No    Lack of Transportation (Non-Medical): No  Physical Activity: Unknown (03/06/2022)   Received from [facility], [facility]   Exercise Vital Sign    Days of Exercise per Week: 0 days    Minutes of Exercise per Session: Not on file  Stress: Patient Declined (07/04/2022)   Received from [facility], [facility] - Occupational Stress Questionnaire    Feeling of Stress : Patient declined  Social Connections: Socially Isolated (03/06/2022)   [facility] from [facility], [facility]   Social Network    How would you rate your social network (family, work, friends)?: Little participation, lonely and socially isolated   Patient reports they are single. No children. Unemployed not in school but applying to [employer].   Substance Use  History:  Patient denies using alcohol. Denies any tobacco or anything. Reports smoking marijuana last 3 to 4 months ago. Denies any other illicit drug use.   Hospital Course:   During the patient's hospitalization, patient had extensive initial psychiatric evaluation, and follow-up psychiatric evaluations every day.   Psychiatric diagnoses provided upon initial assessment: MDD severe recurrent without psychosis GAD History of PTSD History of ADHD   Patient's psychiatric medications were adjusted on admission: -Restart Pristiq 150 mg once daily for MDD, GAD, PTSD.  Patient reports that prior to the onset of the identifiable stressor, her mood was stable and her medications were working well   -Continue Abilify maintained at 400 mg once monthly, last dose 10/31.  Patient is consistently needed augmentation of Abilify for MDD   -Continue hydroxyzine as needed and trazodone as needed for anxiety and sleep   -Restart guanfacine 2 mg every morning for ADHD.  This is a home medication   During the hospitalization, other adjustments were made to the patient's psychiatric medication regimen: Pristiq was continued at 150 mg daily for depression, guanfacine was continued 2 mg daily for ADHD, Atarax was continued as needed for anxiety in addition to trazodone 150 mg at bedtime as needed for sleep   Patient's care was discussed during the interdisciplinary team meeting every day during the hospitalization.   The patient denied having side effects to prescribed psychiatric medication.   Gradually, patient started adjusting to milieu. The patient was evaluated each day by a clinical provider to ascertain response to treatment. Improvement was noted by the patient's report of decreasing symptoms, improved sleep and appetite, affect, medication tolerance, behavior, and participation in unit programming.  Patient was asked each day to complete a self inventory noting mood, mental status, pain, new symptoms,  anxiety and concerns.     Symptoms were reported as significantly decreased or resolved completely by discharge.    On day of discharge, patient was evaluated on 12/30/2022 the patient reports that their mood is stable. The patient denied having suicidal thoughts for more than 48 hours prior to discharge.  Patient denies having homicidal thoughts.  Patient denies having auditory hallucinations.  Patient denies any visual hallucinations or other symptoms of psychosis. The patient was motivated to continue taking medication with a goal of continued improvement in mental health.  On day of discharge patient presented future oriented excited and motivated for discharge able to discuss coping skills with stressors as well as crisis plan if worsening depression or recurring SI after discharge, in agreement to comply with medication treatment as well as follow-up appointment for medication management and counseling. The patient reports their target psychiatric symptoms of depression, SI responded well to the psychiatric medications, and the patient reports overall benefit other psychiatric hospitalization. Supportive psychotherapy was provided to the patient. The patient also participated in regular group therapy while hospitalized. Coping skills, problem solving as well as relaxation therapies were also part of the unit programming.   Labs were reviewed with the patient, and abnormal results were discussed with the patient.   The patient is able to verbalize their individual safety plan to this provider.   Behavioral Events: None   Restraints: None   Groups: Attended and participated   Medications Changes: As above   Sleep  Good, improved during hospital stay    Physical Findings: AIMS: Facial and Oral Movements Muscles of Facial Expression: None Lips and Perioral Area: None Jaw: None Tongue: None,Extremity Movements Upper (arms, wrists, hands, fingers): None Lower (legs, knees, ankles,  toes): None, Trunk Movements Neck, shoulders, hips: None, Global Judgements Severity of abnormal movements overall : None Incapacitation due to abnormal movements: None Patient's awareness of abnormal movements: No Awareness, Dental Status Current problems with teeth and/or dentures?: No Does patient usually wear dentures?: No Edentia?: No  CIWA:    COWS:     Musculoskeletal: Strength & Muscle Tone: within normal limits Gait & Station: normal Patient leans: N/A   Psychiatric Specialty Exam:  General Appearance: appears at stated age, fairly dressed and groomed  Behavior: pleasant and cooperative  Psychomotor Activity:No psychomotor agitation or retardation noted   Eye Contact: good Speech: normal amount, tone, volume and latency   Mood: euthymic Affect: congruent, pleasant and interactive  Thought Process: linear, goal directed, no circumstantial or tangential thought process noted, no racing thoughts or flight of ideas Descriptions of Associations: intact Thought Content: Hallucinations: denies AH, VH ,  does not appear responding to stimuli Delusions: No paranoia or other delusions noted Suicidal Thoughts: denies SI, intention, plan  Homicidal Thoughts: denies HI, intention, plan   Alertness/Orientation: alert and fully oriented  Insight: fair, improved Judgment: fair, improved  Memory: intact  Executive Functions  Concentration: intact  Attention Span: Fair Recall: intact Fund of Knowledge: fair   Assets  Assets: Housing; Physical Health; Communication Skills    Physical Exam:  Physical Exam Vitals and nursing note reviewed.  Constitutional:      Appearance: Normal appearance.  HENT:     Head: Normocephalic and atraumatic.     Nose: Nose normal.  Eyes:     Extraocular Movements: Extraocular movements intact.  Pulmonary:     Effort: Pulmonary effort is normal.  Musculoskeletal:        General: Normal range of motion.     Cervical back: Normal  range of motion.  Neurological:     General: No focal deficit present.     Mental Status: She is alert and oriented to person, place, and time. Mental status is at baseline.  Psychiatric:        Mood and Affect: Mood normal.        Behavior: Behavior normal.        Thought Content: Thought content normal.        Judgment: Judgment normal.    Review of Systems  All other systems reviewed and are negative.  Blood pressure 114/69, pulse (!) 107, temperature 98.4 F (36.9 C), temperature source Oral, resp. rate 16, height 5\' 7"  (1.702 m), weight 86.6 kg, SpO2 97%. Body mass index is 29.91 kg/m.   Social History   Tobacco Use  Smoking Status Former   Types: Cigarettes   Passive exposure: Yes  Smokeless Tobacco Never  Tobacco Comments   Dad vapes in house and car - maybe with nicotine   No nicotine use since 22yo   Tobacco Cessation:  N/A, patient does not currently use tobacco products   Blood Alcohol level:  Lab Results  Component Value Date   ETH <10 12/24/2022   ETH <10 05/12/2022    Metabolic Disorder Labs:  Lab Results  Component Value Date   HGBA1C 5.1 08/23/2022   MPG 100 08/23/2022   MPG 105 01/13/2022   Lab Results  Component Value Date   PROLACTIN 6.4 07/09/2020   PROLACTIN 22.4 (H) 04/02/2020   Lab Results  Component Value Date   CHOL 167 08/23/2022   TRIG 171 (H) 08/23/2022   HDL 31 (L) 08/23/2022   CHOLHDL 5.4 08/23/2022   VLDL 34 08/23/2022   LDLCALC 102 (H) 08/23/2022   LDLCALC 143 (H) 01/13/2022    See Psychiatric Specialty Exam and Suicide Risk Assessment completed by Attending Physician prior to discharge.  Discharge destination:  Home with father  Is patient on multiple antipsychotic therapies at discharge:  No   Has Patient had three or more failed trials of antipsychotic monotherapy by history:  No  Recommended Plan for Multiple Antipsychotic Therapies: NA  Discharge Instructions     Diet - low sodium heart healthy   Complete  by: As directed    Increase activity slowly   Complete by: As directed       Allergies as of 12/30/2022   No Known Allergies      Medication List     STOP taking these medications    levocetirizine 5 MG tablet Commonly known as: XYZAL       TAKE these  medications      Indication  Abilify Maintena 400 MG Prsy prefilled syringe Generic drug: ARIPiprazole ER Inject 400 mg into the muscle every 28 (twenty-eight) days.  Indication: Manic-Depression   desvenlafaxine 50 MG 24 hr tablet Commonly known as: PRISTIQ Take 3 tablets (150 mg total) by mouth daily. What changed: See the new instructions.  Indication: Major Depressive Disorder   estradiol valerate 20 MG/ML injection Commonly known as: DELESTROGEN Inject 20 mg into the muscle every Sunday.  Indication: Transgender Woman   guanFACINE 2 MG Tb24 ER tablet Commonly known as: Intuniv Take 1 tablet (2 mg total) by mouth daily.  Indication: irritability   hydrOXYzine 25 MG tablet Commonly known as: ATARAX Take 1 tablet (25 mg total) by mouth 3 (three) times daily as needed for anxiety. What changed: when to take this  Indication: Feeling Anxious   metoprolol succinate 25 MG 24 hr tablet Commonly known as: TOPROL-XL Take 12.5 mg by mouth daily.  Indication: Supraventricular Tachycardia   montelukast 10 MG tablet Commonly known as: SINGULAIR Take 10 mg by mouth at bedtime.  Indication: Hayfever   progesterone 200 MG capsule Commonly known as: PROMETRIUM Take 200 mg by mouth daily. What changed: Another medication with the same name was removed. Continue taking this medication, and follow the directions you see here.  Indication: Absence of Menstrual Periods in Woman who has had Them   traZODone 150 MG tablet Commonly known as: DESYREL Take 1 tablet (150 mg total) by mouth at bedtime as needed for sleep. What changed:  medication strength how much to take  Indication: Trouble Sleeping   triamcinolone  ointment 0.5 % Commonly known as: KENALOG Apply 1 application. topically 2 (two) times daily. What changed:  when to take this reasons to take this  Indication: Allergic Contact Dermatitis        Follow-up Information     [facility]. Go to.   Specialty: Behavioral Health Why: Please go to this provider for medication management services. For fastest service, please go Monday through Friday, arrive by 7:00 am for same day service. Contact information: [address] [place] [address] [phone]        [facility]. Go to.   Specialty: Professional Counselor Why: Please go to this provider for therapy services during walk in hours for new patients:  Monday through Friday from 9 am to 1 pm. Contact information: [address] [place] [address] [phone]         [facility]. Call on 01/05/2023.   Specialty: Behavioral Health Why: You are scheduled for an assessment for the PHP on Tuesday, 11/19 at 1:00 pm. This appointment will last approximately one hour and will be virtual via [employer] Please download the Teams app prior to the appointment. If you need to cancel or reschedule, please call [phone] and leave a voicemail with your name, date of birth, and phone number. Contact information: [address] [place] [address] [phone]                Discharge recommendations:    Activity: as tolerated  Diet: heart healthy  # It is recommended to the patient to continue psychiatric medications as prescribed, after discharge from the hospital.     # It is recommended to the patient to follow up with your outpatient psychiatric provider and PCP.   # It was discussed with the patient, the impact of alcohol,  drugs, tobacco have been there overall psychiatric and medical wellbeing, and total abstinence from substance use was  recommended the patient.ed.   # Prescriptions provided or sent directly to preferred pharmacy at discharge. Patient agreeable to plan. Given opportunity to ask questions. Appears to feel comfortable with discharge.    # In the event of worsening symptoms, the patient is instructed to call the crisis hotline, 911 and or go to the nearest ED for appropriate evaluation and treatment of symptoms. To follow-up with primary care provider for other medical issues, concerns and or health care needs   # Patient was discharged home with a plan to follow up as noted above.  -Follow-up with outpatient primary care doctor and other specialists -for management of chronic medical disease, including: Patient was recommended to follow-up with primary care provider as well as endocrinologist for hormone treatment, she agrees.    Patient agrees with D/C instructions and plan.   The patient received suicide prevention pamphlet:  Yes Belongings returned:  Clothing and Valuables  Total Time Spent in Direct Patient Care:  I personally spent 45 minutes on the unit in direct patient care. The direct patient care time included face-to-face time with the patient, reviewing the patient's chart, communicating with other professionals, and coordinating care. Greater than 50% of this time was spent in counseling or coordinating care with the patient regarding goals of hospitalization, psycho-education, and discharge planning needs.    SignedSarita Bottom, MD 12/30/2022, 9:40 AM
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
## 2022-12-30 NOTE — Plan of Care (Signed)
  Problem: Education: Goal: Emotional status will improve Outcome: Progressing Goal: Mental status will improve Outcome: Progressing   Problem: Activity: Goal: Interest or engagement in activities will improve Outcome: Progressing Goal: Sleeping patterns will improve Outcome: Progressing

## 2022-12-30 NOTE — Progress Notes (Signed)
  [facility] Adult Case Management Discharge Plan :  Will you be returning to the same living situation after discharge:  Yes,  pt will be returning home with father, Veryl Speak [phone] At discharge, do you have transportation home?: Yes,  pt will be transported by friend Angelique Blonder [phone] Do you have the ability to pay for your medications: Yes,  pt has active medical coverage  Release of information consent forms completed and in the chart;  Patient's signature needed at discharge.  Patient to Follow up at:  Follow-up Information     [facility]. Go to.   Specialty: Behavioral Health Why: Please go to this provider for medication management services. For fastest service, please go Monday through Friday, arrive by 7:00 am for same day service. Contact information: [address] [place] [address] [phone]        [facility]. Go to.   Specialty: Professional Counselor Why: Please go to this provider for therapy services during walk in hours for new patients:  Monday through Friday from 9 am to 1 pm. Contact information: [address] [place] [address] [phone]         [facility]. Call on 01/05/2023.   Specialty: Behavioral Health Why: You are scheduled for an assessment for the PHP on Tuesday, 11/19 at 1:00 pm. This appointment will last approximately one hour and will be virtual via [employer] Please download the Teams app prior to the appointment. If you need to cancel or reschedule, please call [phone] and leave a voicemail with your name, date of birth, and phone number. Contact information: [address] [place] [address] [phone]                Next level of care provider has access to [facility] Link:yes  Safety Planning and Suicide Prevention discussed: Yes,  SPE discussed and pamphlet will be given at the time of  discharge.  CSW spoke with Angelique Blonder [phone] who reported they will lock away all medications, sharp objects and there are no guns in the home.      Has patient been referred to the Quitline?: Patient does not use tobacco/nicotine products  Patient has been referred for addiction treatment: Patient refused referral for treatment.  Khup Sapia, Candace Cruise, LCSWA 12/30/2022, 9:47 AM

## 2022-12-30 NOTE — Progress Notes (Signed)
   12/30/22 0834  Psych Admission Type (Psych Patients Only)  Admission Status Involuntary  Psychosocial Assessment  Patient Complaints None  Eye Contact Fair  Facial Expression Animated  Affect Anxious  Speech Logical/coherent  Interaction Assertive  Motor Activity Slow  Appearance/Hygiene Unremarkable  Behavior Characteristics Cooperative;Appropriate to situation  Mood Depressed;Anxious  Thought Process  Coherency WDL  Content WDL  Delusions None reported or observed  Perception WDL  Hallucination None reported or observed  Judgment Impaired  Confusion None  Danger to Self  Current suicidal ideation? Denies  Description of Suicide Plan No plan  Self-Injurious Behavior No self-injurious ideation or behavior indicators observed or expressed   Agreement Not to Harm Self Yes  Description of Agreement Verbal  Danger to Others  Danger to Others None reported or observed

## 2022-12-30 NOTE — Plan of Care (Signed)
  Problem: Education: Goal: Knowledge of [facility] Education information/materials will improve Outcome: Progressing   Problem: Education: Goal: Mental status will improve Outcome: Progressing   Problem: Coping: Goal: Ability to verbalize frustrations and anger appropriately will improve Outcome: Progressing   Problem: Health Behavior/Discharge Planning: Goal: Compliance with treatment plan for underlying cause of condition will improve Outcome: Progressing   Problem: Safety: Goal: Periods of time without injury will increase Outcome: Progressing

## 2022-12-30 NOTE — Progress Notes (Addendum)
Patient discharged from [facility] on 12/30/22. Patient denies SI, plan, and intention. Suicide safety plan completed, reviewed with this RN, given to the patient, and a copy in the chart. Patient denies HI/AVH upon discharge. Patient is alert, oriented, and cooperative. RN provided patient with discharge paperwork and reviewed information with patient. Patient expressed that she understood all of the discharge instructions. Pt was satisfied with belongings returned to her from the locker and at bedside. Discharged patient to [facility] waiting room.

## 2023-01-05 ENCOUNTER — Ambulatory Visit (HOSPITAL_COMMUNITY): Payer: Medicaid Other | Admitting: Professional

## 2023-01-05 DIAGNOSIS — F603 Borderline personality disorder: Secondary | ICD-10-CM | POA: Diagnosis not present
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
## 2023-01-05 NOTE — [facility] Group Notes (Signed)
Spiritual care group on grief and loss facilitated by Chaplain Dyanne Carrel, Bcc and Arlyce Dice, Mdiv  Group Goal: Support / Education around grief and loss  Members engage in facilitated group support and psycho-social education.  Group Description:  Following introductions and group rules, group members engaged in facilitated group dialogue and support around topic of loss, with particular support around experiences of loss in their lives. Group Identified types of loss (relationships / self / things) and identified patterns, circumstances, and changes that precipitate losses. Reflected on thoughts / feelings around loss, normalized grief responses, and recognized variety in grief experience. Group encouraged individual reflection on safe space and on the coping skills that they are already utilizing.  Group drew on Adlerian / Rogerian and narrative framework  Patient Progress: Scott [name] attended part of group and participated in group conversation and activities.

## 2023-01-07 ENCOUNTER — Ambulatory Visit (HOSPITAL_COMMUNITY): Payer: MEDICAID | Admitting: Student

## 2023-01-07 ENCOUNTER — Encounter (HOSPITAL_COMMUNITY): Payer: MEDICAID | Admitting: Student

## 2023-01-07 DIAGNOSIS — Z9151 Personal history of suicidal behavior: Secondary | ICD-10-CM | POA: Diagnosis not present

## 2023-01-07 DIAGNOSIS — Z8659 Personal history of other mental and behavioral disorders: Secondary | ICD-10-CM

## 2023-01-07 DIAGNOSIS — F603 Borderline personality disorder: Secondary | ICD-10-CM | POA: Diagnosis not present

## 2023-01-07 DIAGNOSIS — F902 Attention-deficit hyperactivity disorder, combined type: Secondary | ICD-10-CM | POA: Diagnosis not present

## 2023-01-07 DIAGNOSIS — F3132 Bipolar disorder, current episode depressed, moderate: Secondary | ICD-10-CM

## 2023-01-07 DIAGNOSIS — F4310 Post-traumatic stress disorder, unspecified: Secondary | ICD-10-CM

## 2023-01-07 DIAGNOSIS — F332 Major depressive disorder, recurrent severe without psychotic features: Secondary | ICD-10-CM

## 2023-01-07 DIAGNOSIS — Z79899 Other long term (current) drug therapy: Secondary | ICD-10-CM

## 2023-01-07 DIAGNOSIS — F649 Gender identity disorder, unspecified: Secondary | ICD-10-CM

## 2023-01-07 MED ORDER — TRAZODONE HCL 50 MG PO TABS: 50.0000 mg | ORAL_TABLET | Freq: Every evening | ORAL | Status: DC | PRN

## 2023-01-07 MED ORDER — DESVENLAFAXINE SUCCINATE ER 50 MG PO TB24: 150.0000 mg | ORAL_TABLET | Freq: Every day | ORAL | 0 refills | Status: DC

## 2023-01-07 MED ORDER — GUANFACINE HCL ER 2 MG PO TB24: 2.0000 mg | ORAL_TABLET | Freq: Every day | ORAL | 0 refills | Status: DC
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
## 2023-01-12 ENCOUNTER — Ambulatory Visit (HOSPITAL_COMMUNITY): Payer: Medicaid Other

## 2023-01-12 ENCOUNTER — Encounter (HOSPITAL_COMMUNITY): Payer: Self-pay

## 2023-01-12 VITALS — BP 105/60 | HR 66 | Wt 195.4 lb

## 2023-01-12 DIAGNOSIS — G4700 Insomnia, unspecified: Secondary | ICD-10-CM

## 2023-01-12 DIAGNOSIS — F200 Paranoid schizophrenia: Secondary | ICD-10-CM

## 2023-01-12 DIAGNOSIS — F411 Generalized anxiety disorder: Secondary | ICD-10-CM

## 2023-01-12 NOTE — Progress Notes (Cosign Needed)
PATIENT PRESENTS TO THE OFFICE FOR ABILIFY MAINTENNA 400 MG INJECTION GIVEN BY Cabria Micalizzi , PT TOLERATED INJECTION WELL IN  RIGHT DELTOID AND WILL RETURN IN 28 DAYS

## 2023-02-09 ENCOUNTER — Encounter (HOSPITAL_COMMUNITY): Payer: Self-pay | Admitting: Psychiatry

## 2023-02-09 ENCOUNTER — Ambulatory Visit (INDEPENDENT_AMBULATORY_CARE_PROVIDER_SITE_OTHER): Payer: MEDICAID | Admitting: Psychiatry

## 2023-02-09 DIAGNOSIS — F902 Attention-deficit hyperactivity disorder, combined type: Secondary | ICD-10-CM

## 2023-02-09 DIAGNOSIS — F603 Borderline personality disorder: Secondary | ICD-10-CM | POA: Diagnosis not present

## 2023-02-09 DIAGNOSIS — F4310 Post-traumatic stress disorder, unspecified: Secondary | ICD-10-CM

## 2023-02-09 DIAGNOSIS — F332 Major depressive disorder, recurrent severe without psychotic features: Secondary | ICD-10-CM | POA: Diagnosis not present

## 2023-02-09 MED ORDER — TRAZODONE HCL 50 MG PO TABS: 50.0000 mg | ORAL_TABLET | Freq: Every evening | ORAL | Status: DC | PRN

## 2023-02-09 MED ORDER — HYDROXYZINE HCL 25 MG PO TABS: 25.0000 mg | ORAL_TABLET | Freq: Every day | ORAL | 0 refills | Status: DC | PRN

## 2023-02-09 MED ORDER — GUANFACINE HCL ER 3 MG PO TB24: 3.0000 mg | ORAL_TABLET | Freq: Every day | ORAL | 0 refills | Status: DC
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
## 2023-02-12 ENCOUNTER — Encounter (HOSPITAL_COMMUNITY): Payer: Self-pay | Admitting: Psychiatry

## 2023-02-12 ENCOUNTER — Ambulatory Visit (HOSPITAL_COMMUNITY): Payer: MEDICAID

## 2023-02-12 ENCOUNTER — Ambulatory Visit (INDEPENDENT_AMBULATORY_CARE_PROVIDER_SITE_OTHER): Payer: MEDICAID

## 2023-02-12 VITALS — BP 106/68 | Temp 98.7°F | Ht 66.0 in | Wt 200.0 lb

## 2023-02-12 DIAGNOSIS — F332 Major depressive disorder, recurrent severe without psychotic features: Secondary | ICD-10-CM

## 2023-02-12 MED ORDER — ARIPIPRAZOLE ER 400 MG IM PRSY
400.0000 mg | PREFILLED_SYRINGE | Freq: Once | INTRAMUSCULAR | Status: AC
  Administered 2023-02-12: 400 mg via INTRAMUSCULAR

## 2023-02-12 NOTE — Progress Notes (Signed)
Patient in today for due Abilify Maintena 400 mg IM every 28 day injection.  Patient presented with appropriate affect and level mood and denied any auditory or visual hallucinations, no suicidal or homicidal ideations, plans, intent or means noted at this time.  Patient reported doing fine with injection every 28 days and denied any current side effects or problems with present medication regimen.  Patient's Abilify Maintena 400 mg IM injection prepared as ordered and given to patient in their Left Upper Outer Gluteal areas as requested by patient since we did not have any freeze spray to apply to the injection site.   Patient reported no issues with injection and denied any pain or discomfort after initial few seconds post injection.  Patient agreed to return in 4 weeks for next due injection and to call if any issues prior to next appointment.

## 2023-02-15 ENCOUNTER — Telehealth (HOSPITAL_COMMUNITY): Payer: Self-pay

## 2023-02-15 NOTE — Telephone Encounter (Signed)
Request sent 

## 2023-03-10 ENCOUNTER — Encounter (HOSPITAL_COMMUNITY): Payer: Self-pay | Admitting: Psychiatry

## 2023-03-10 ENCOUNTER — Telehealth (HOSPITAL_COMMUNITY): Payer: MEDICAID | Admitting: Psychiatry

## 2023-03-10 DIAGNOSIS — F603 Borderline personality disorder: Secondary | ICD-10-CM

## 2023-03-10 DIAGNOSIS — F3132 Bipolar disorder, current episode depressed, moderate: Secondary | ICD-10-CM

## 2023-03-10 DIAGNOSIS — F4310 Post-traumatic stress disorder, unspecified: Secondary | ICD-10-CM | POA: Diagnosis not present

## 2023-03-10 DIAGNOSIS — F332 Major depressive disorder, recurrent severe without psychotic features: Secondary | ICD-10-CM | POA: Diagnosis not present

## 2023-03-10 DIAGNOSIS — Z818 Family history of other mental and behavioral disorders: Secondary | ICD-10-CM

## 2023-03-10 DIAGNOSIS — F902 Attention-deficit hyperactivity disorder, combined type: Secondary | ICD-10-CM

## 2023-03-10 DIAGNOSIS — G4700 Insomnia, unspecified: Secondary | ICD-10-CM

## 2023-03-10 DIAGNOSIS — F411 Generalized anxiety disorder: Secondary | ICD-10-CM

## 2023-03-10 MED ORDER — DESVENLAFAXINE SUCCINATE ER 50 MG PO TB24: 150.0000 mg | ORAL_TABLET | Freq: Every day | ORAL | 1 refills | Status: DC

## 2023-03-10 MED ORDER — HYDROXYZINE HCL 25 MG PO TABS: 25.0000 mg | ORAL_TABLET | Freq: Every day | ORAL | 1 refills | Status: DC | PRN

## 2023-03-10 MED ORDER — GUANFACINE HCL ER 3 MG PO TB24
3.0000 mg | ORAL_TABLET | Freq: Every day | ORAL | 1 refills | Status: DC
Start: 2023-03-10 — End: 2023-05-12

## 2023-03-10 MED ORDER — TRAZODONE HCL 50 MG PO TABS: 50.0000 mg | ORAL_TABLET | Freq: Every evening | ORAL | Status: DC | PRN

## 2023-03-10 MED ORDER — ABILIFY MAINTENA 400 MG IM PRSY: 400.0000 mg | PREFILLED_SYRINGE | INTRAMUSCULAR | 2 refills | Status: DC
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
## 2023-03-12 ENCOUNTER — Ambulatory Visit (HOSPITAL_COMMUNITY): Payer: MEDICAID | Admitting: Psychiatry

## 2023-03-19 ENCOUNTER — Ambulatory Visit (INDEPENDENT_AMBULATORY_CARE_PROVIDER_SITE_OTHER): Payer: MEDICAID | Admitting: Psychiatry

## 2023-03-19 ENCOUNTER — Encounter (HOSPITAL_COMMUNITY): Payer: Self-pay | Admitting: Psychiatry

## 2023-03-19 VITALS — BP 110/75 | HR 82 | Ht 66.0 in | Wt 207.0 lb

## 2023-03-19 DIAGNOSIS — F3132 Bipolar disorder, current episode depressed, moderate: Secondary | ICD-10-CM

## 2023-03-19 MED ORDER — ARIPIPRAZOLE ER 400 MG IM PRSY
400.0000 mg | PREFILLED_SYRINGE | Freq: Once | INTRAMUSCULAR | Status: AC
  Administered 2023-03-19: 400 mg via INTRAMUSCULAR

## 2023-03-19 NOTE — Progress Notes (Signed)
Patient arrived for Injection: ARIPiprazole ER (ABILIFY MAINTENA) 400 MG  Patient stated last injection was painful in Gluteal & asked for Freeze spray. Informed that we don't have that in this office & that currently [facility] is the only office that does use freeze spray.  I promised to be gentle & painless as possible if he would let give injection in his arm.  Patient was pleasant & pleased that his injection was painless minus the needle going into skin. TOLERATED INJECTION WELL IN LEFT DELTOID  NO SI/HI NOR AH/VH

## 2023-03-19 NOTE — Patient Instructions (Signed)
Patient arrived for Injection: ARIPiprazole ER (ABILIFY MAINTENA) 400 MG  Patient stated last injection was painful in Gluteal & asked for Freeze spray. Informed that we don't have that in this office & that currently [facility] is the only office that does use freeze spray.  I promised to be gentle & painless as possible if he would let give injection in his arm.  Patient was pleasant & pleased that his injection was painless minus the needle going into skin. TOLERATED INJECTION WELL IN LEFT DELTOID  NO SI/HI NOR AH/VH

## 2023-04-07 ENCOUNTER — Ambulatory Visit (HOSPITAL_COMMUNITY)
Admission: EM | Admit: 2023-04-07 | Discharge: 2023-04-07 | Disposition: A | Discharge status: Home / Self Care | Payer: MEDICAID

## 2023-04-07 NOTE — ED Notes (Signed)
 Left AMA

## 2023-04-07 NOTE — ED Notes (Signed)
Pt left without being seen prior to being triaged at 2015.

## 2023-04-07 NOTE — ED Notes (Signed)
Patient left prior to being triaged and seen by a provider.

## 2023-04-17 ENCOUNTER — Ambulatory Visit (HOSPITAL_COMMUNITY)
Admission: EM | Admit: 2023-04-17 | Discharge: 2023-04-17 | Disposition: A | Discharge status: Home / Self Care | Payer: MEDICAID | Attending: Nurse Practitioner | Admitting: Nurse Practitioner

## 2023-04-17 DIAGNOSIS — F603 Borderline personality disorder: Secondary | ICD-10-CM | POA: Insufficient documentation

## 2023-04-17 DIAGNOSIS — F909 Attention-deficit hyperactivity disorder, unspecified type: Secondary | ICD-10-CM | POA: Insufficient documentation

## 2023-04-17 DIAGNOSIS — F331 Major depressive disorder, recurrent, moderate: Secondary | ICD-10-CM | POA: Insufficient documentation

## 2023-04-17 DIAGNOSIS — F4310 Post-traumatic stress disorder, unspecified: Secondary | ICD-10-CM | POA: Insufficient documentation

## 2023-04-17 DIAGNOSIS — Z79899 Other long term (current) drug therapy: Secondary | ICD-10-CM | POA: Insufficient documentation

## 2023-04-17 DIAGNOSIS — F411 Generalized anxiety disorder: Secondary | ICD-10-CM | POA: Insufficient documentation

## 2023-04-17 NOTE — Discharge Instructions (Signed)

## 2023-04-17 NOTE — ED Provider Notes (Signed)
 Behavioral Health Urgent Care Medical Screening Exam  Patient Name: Scott [name] MRN: [id number] Date of Evaluation: 04/18/23 Chief Complaint:  I am having nightmares Diagnosis:  Final diagnoses:  Borderline personality disorder (HCC)  Moderate episode of recurrent major depressive disorder (HCC)    History of Present illness: Scott [name] "[name]" is a 23 y.o. adult.  With a history of MDD, borderline personality disorder, ADHD, PTSD and GAD presented to [facility] as a walk in voluntarily unaccompanied with complaints of having nightmares for the past three weeks.   [name], 23 y.o., adult patient seen face to face by this provider and chart reviewed on 04/17/2023.  On evaluation Scott [name] reports that she has been having nightmares about her dying. Patient reports that she is not sure why she is having these dreams. Patient denies any recent traumas. Patient has a stuffed shark and states that it helps her from having the nightmares. Patient states that he wants to be admitted to be admitted to [facility] because she wants to have someone watch her sleep. Patient reports that Dr. Thresa Ross is her psychiatrist and is prescribing patient Abilify maintainer 400 mg, desvenlafaxine 150 mg, guanfacine 3 mg, hydroxyzine 25 mg and Toprol xL 25 mg.  Patient reports that she has a next appointment on April 20, 2023 with Dr. Gilmore Laroche.  Patient states that she lives with 2 other roommates.  Patient states that her one of her roommates plays video games most of the night. Discussed with patient that since her stuffed shark helps her not to have nightmares, she can have her roommate check on her throughout the night while she sleeps.  Patient is in agreement that this is a good plan and she is able to contract for safety.  During evaluation Scott [name] is sitting in the triage room in no acute distress. She is alert, oriented x 4, calm, cooperative and attentive.  Her mood is euthymic with  congruent affect.  She has normal speech, and behavior.  Objectively there is no evidence of psychosis/mania or delusional thinking.  Patient is able to converse coherently, goal directed thoughts, no distractibility, or pre-occupation.  She also denies suicidal/self-harm/homicidal ideation, psychosis, and paranoia.  Patient answered question appropriately.  Patient will be discharged to follow-up with her outpatient psychiatrist Dr. Gilmore Laroche for ongoing medication management.   Flowsheet Row ED from 04/17/2023 in [facility] Video Visit from 03/10/2023 in [facility] at [facility] Office Visit from 02/09/2023 in [facility] at [facility]  C-SSRS RISK CATEGORY Moderate Risk Error: Q3, 4, or 5 should not be populated when Q2 is No Error: Question 6 not populated       Psychiatric Specialty Exam  Presentation  General Appearance:Disheveled  Eye Contact:Fair  Speech:Clear and Coherent  Speech Volume:Normal  Handedness:Left   Mood and Affect  Mood: Anxious  Affect: Blunt; Flat   Thought Process  Thought Processes: Coherent  Descriptions of Associations:Intact  Orientation:Full (Time, Place and Person)  Thought Content:WDL  Diagnosis of Schizophrenia or Schizoaffective disorder in past: No   Hallucinations:None  Ideas of Reference:None  Suicidal Thoughts:No  Homicidal Thoughts:No   Sensorium  Memory: Immediate Fair; Recent Fair; Remote Fair  Judgment: Intact  Insight: Fair   Chartered certified accountant: Fair  Attention Span: Fair  Recall: [employer] of Knowledge: Fair  Language: Fair   Psychomotor Activity  Psychomotor Activity: Normal   Assets  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      Nose: Nose normal.  Eyes:     Pupils: Pupils are equal, round, and reactive to light.  Cardiovascular:     Rate and Rhythm: Normal rate.  Pulmonary:     Effort: Pulmonary effort is normal.  Abdominal:     General: Abdomen is flat.  Musculoskeletal:        General: Normal range of motion.     Cervical back: Normal range of motion.  Skin:    General: Skin is warm.  Neurological:     Mental Status: She is alert and oriented to person, place, and time.  Psychiatric:        Attention and Perception: Attention normal.        Mood and Affect: Affect is tearful.        Speech: Speech normal.        Behavior: Behavior normal.        Thought Content: Thought content does not include homicidal or suicidal ideation. Thought content does not include homicidal or suicidal plan.        Cognition and Memory: Cognition normal.        Judgment: Judgment is impulsive.    Review of Systems  Constitutional: Negative.   HENT: Negative.    Eyes: Negative.   Respiratory: Negative.    Cardiovascular: Negative.   Gastrointestinal: Negative.   Genitourinary: Negative.   Musculoskeletal: Negative.   Skin: Negative.   Neurological: Negative.   Endo/Heme/Allergies: Negative.   Psychiatric/Behavioral: Negative.     Blood pressure 117/80, temperature 98.1 F (36.7 C), temperature source Oral, resp. rate 18, SpO2 98%. There is no height or weight on file to calculate BMI.  Musculoskeletal: Strength & Muscle Tone: within normal limits Gait & Station: normal Patient leans: N/A   [facility] MSE Discharge Disposition for Follow up and Recommendations: Based on my evaluation the patient does not appear to have an emergency medical condition and can be discharged with resources and follow up care in outpatient services for Medication Management and Individual Therapy   [name], NP 04/18/2023, 6:04 AM
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
## 2023-04-20 ENCOUNTER — Ambulatory Visit (INDEPENDENT_AMBULATORY_CARE_PROVIDER_SITE_OTHER): Payer: MEDICAID | Admitting: Psychiatry

## 2023-04-20 ENCOUNTER — Encounter (HOSPITAL_COMMUNITY): Payer: Self-pay | Admitting: Psychiatry

## 2023-04-20 VITALS — BP 119/83 | HR 102 | Ht 66.0 in | Wt 219.0 lb

## 2023-04-20 DIAGNOSIS — F3132 Bipolar disorder, current episode depressed, moderate: Secondary | ICD-10-CM | POA: Diagnosis not present

## 2023-04-20 MED ORDER — ARIPIPRAZOLE ER 400 MG IM PRSY
400.0000 mg | PREFILLED_SYRINGE | Freq: Once | INTRAMUSCULAR | Status: AC
  Administered 2023-04-20: 400 mg via INTRAMUSCULAR

## 2023-04-20 NOTE — Progress Notes (Cosign Needed Addendum)
 Patient arrived for monthly  ARIPiprazole ER (ABILIFY MAINTENA) 400 MG prefilled syringe .  Pleasant and social. Tolerated injection well in Left Deltoid.    NO SI/HI NOR AH/VH Shared he's having a hard time sleeping keep having nightmares about Death

## 2023-04-20 NOTE — Patient Instructions (Signed)
 Patient arrived for monthly  ARIPiprazole ER (ABILIFY MAINTENA) 400 MG prefilled syringe .  Pleasant and social. Tolerated injection well in Left Deltoid.    NO SI/HI NOR AH/VH

## 2023-05-12 ENCOUNTER — Encounter (HOSPITAL_COMMUNITY): Payer: Self-pay | Admitting: Psychiatry

## 2023-05-12 ENCOUNTER — Telehealth: Payer: Self-pay

## 2023-05-12 ENCOUNTER — Other Ambulatory Visit (HOSPITAL_COMMUNITY): Payer: Self-pay

## 2023-05-12 ENCOUNTER — Telehealth (HOSPITAL_COMMUNITY): Payer: MEDICAID | Admitting: Psychiatry

## 2023-05-12 DIAGNOSIS — F4310 Post-traumatic stress disorder, unspecified: Secondary | ICD-10-CM | POA: Diagnosis not present

## 2023-05-12 DIAGNOSIS — F902 Attention-deficit hyperactivity disorder, combined type: Secondary | ICD-10-CM

## 2023-05-12 DIAGNOSIS — F332 Major depressive disorder, recurrent severe without psychotic features: Secondary | ICD-10-CM

## 2023-05-12 DIAGNOSIS — F603 Borderline personality disorder: Secondary | ICD-10-CM

## 2023-05-12 DIAGNOSIS — F3132 Bipolar disorder, current episode depressed, moderate: Secondary | ICD-10-CM

## 2023-05-12 MED ORDER — GUANFACINE HCL ER 3 MG PO TB24
3.0000 mg | ORAL_TABLET | Freq: Every day | ORAL | 1 refills | Status: DC
Start: 2023-05-12 — End: 2023-06-28

## 2023-05-12 MED ORDER — TRAZODONE HCL 50 MG PO TABS: 50.0000 mg | ORAL_TABLET | Freq: Every evening | ORAL | Status: DC | PRN

## 2023-05-12 MED ORDER — DESVENLAFAXINE SUCCINATE ER 50 MG PO TB24: 150.0000 mg | ORAL_TABLET | Freq: Every day | ORAL | 1 refills | Status: DC
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
## 2023-05-12 NOTE — Telephone Encounter (Signed)
 Pharmacy Patient Advocate Encounter   Received notification from Onbase that prior authorization for Abilfy Main is required/requested.   Insurance verification completed.   The patient is insured through [place] .   Please provide NPI and office information for Dr Gilmore Laroche to complete the prior authorization process.

## 2023-05-13 ENCOUNTER — Telehealth: Payer: Self-pay | Admitting: Pharmacy Technician

## 2023-05-13 ENCOUNTER — Other Ambulatory Visit (HOSPITAL_COMMUNITY): Payer: Self-pay

## 2023-05-13 NOTE — Telephone Encounter (Signed)
 Pharmacy Patient Advocate Encounter   Received notification from CoverMyMeds that prior authorization for Abilify Maintena 400MG  syringes is required/requested.   Insurance verification completed.   The patient is insured through [place] .   Per test claim: The current 28 day co-pay is, $0.00.  No PA needed at this time. This test claim was processed through [facility]- copay amounts may vary at other pharmacies due to pharmacy/plan contracts, or as the patient moves through the different stages of their insurance plan.

## 2023-05-18 ENCOUNTER — Ambulatory Visit (HOSPITAL_COMMUNITY): Payer: MEDICAID | Admitting: Psychiatry

## 2023-05-18 ENCOUNTER — Other Ambulatory Visit (HOSPITAL_COMMUNITY): Payer: Self-pay | Admitting: *Deleted

## 2023-05-18 DIAGNOSIS — F3132 Bipolar disorder, current episode depressed, moderate: Secondary | ICD-10-CM

## 2023-05-18 MED ORDER — ABILIFY MAINTENA 400 MG IM PRSY: 400.0000 mg | PREFILLED_SYRINGE | INTRAMUSCULAR | 12 refills | Status: AC

## 2023-05-18 NOTE — Telephone Encounter (Signed)
error 

## 2023-05-20 ENCOUNTER — Telehealth (HOSPITAL_COMMUNITY): Payer: Self-pay

## 2023-05-20 ENCOUNTER — Encounter (HOSPITAL_COMMUNITY): Payer: Self-pay | Admitting: Psychiatry

## 2023-05-20 ENCOUNTER — Ambulatory Visit (INDEPENDENT_AMBULATORY_CARE_PROVIDER_SITE_OTHER): Payer: MEDICAID

## 2023-05-20 VITALS — BP 126/84 | HR 98 | Temp 98.3°F | Ht 66.0 in | Wt 219.0 lb

## 2023-05-20 DIAGNOSIS — F3132 Bipolar disorder, current episode depressed, moderate: Secondary | ICD-10-CM

## 2023-05-20 MED ORDER — ARIPIPRAZOLE ER 400 MG IM PRSY
400.0000 mg | PREFILLED_SYRINGE | INTRAMUSCULAR | Status: DC
  Administered 2023-05-20 – 2023-06-17 (×2): 400 mg via INTRAMUSCULAR

## 2023-05-20 NOTE — Telephone Encounter (Signed)
 Medication management - Fax received from [facility] stating patient's Abilify Maintena 400 mg IM injection was approved for next 1 time dosage.

## 2023-05-20 NOTE — Progress Notes (Addendum)
 Patient in today for due Abilify Maintena 400 mg Im injection.  Patient presented with appropriate affect, level and pleasant mood and denied any auditory or visual hallucinations, no suicidal or homicidal ideations, plans or intent to want to harm self or others at this time.  Patient reported no issues with current medication regimen and stated doing well with Abilify Maintena injections. Patient's due injection prepared as ordered and given to patient in their left deltoid area.  Patient tolerated due injection without any complaints of pain or discomfort and agreed to return in 28 days for next due injection.  Patient to call if any issues prior to next scheduled injection date.

## 2023-05-25 ENCOUNTER — Telehealth (HOSPITAL_COMMUNITY): Payer: Self-pay | Admitting: *Deleted

## 2023-05-25 NOTE — Telephone Encounter (Signed)
 CHECKING PATIENT DOCUMENTATION ON INJECTION

## 2023-06-01 ENCOUNTER — Encounter (HOSPITAL_COMMUNITY): Payer: Self-pay

## 2023-06-01 NOTE — Telephone Encounter (Signed)
 Mental Health Disability Form

## 2023-06-03 ENCOUNTER — Encounter (HOSPITAL_COMMUNITY): Payer: Self-pay | Admitting: Psychiatry

## 2023-06-03 ENCOUNTER — Ambulatory Visit (INDEPENDENT_AMBULATORY_CARE_PROVIDER_SITE_OTHER): Payer: MEDICAID | Admitting: Psychiatry

## 2023-06-03 VITALS — BP 130/84 | HR 101 | Ht 66.0 in | Wt 220.0 lb

## 2023-06-03 DIAGNOSIS — F902 Attention-deficit hyperactivity disorder, combined type: Secondary | ICD-10-CM | POA: Diagnosis not present

## 2023-06-03 DIAGNOSIS — F332 Major depressive disorder, recurrent severe without psychotic features: Secondary | ICD-10-CM | POA: Diagnosis not present

## 2023-06-03 DIAGNOSIS — F603 Borderline personality disorder: Secondary | ICD-10-CM | POA: Diagnosis not present

## 2023-06-03 DIAGNOSIS — F4310 Post-traumatic stress disorder, unspecified: Secondary | ICD-10-CM

## 2023-06-03 MED ORDER — GUANFACINE HCL ER 2 MG PO TB24: 2.0000 mg | ORAL_TABLET | Freq: Every day | ORAL | 0 refills | Status: DC
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
## 2023-06-17 ENCOUNTER — Encounter (HOSPITAL_COMMUNITY): Payer: Self-pay | Admitting: Psychiatry

## 2023-06-17 ENCOUNTER — Ambulatory Visit (HOSPITAL_COMMUNITY): Payer: MEDICAID | Admitting: Psychiatry

## 2023-06-17 VITALS — BP 130/81 | HR 107 | Ht 66.0 in | Wt 221.0 lb

## 2023-06-17 DIAGNOSIS — F603 Borderline personality disorder: Secondary | ICD-10-CM

## 2023-06-17 NOTE — Progress Notes (Unsigned)
 Patient arrived for Q 28 monthly injection-- ARIPiprazole  ER (ABILIFY  MAINTENA) 400 MG PRSY prefilled syringe . Tolerated well in Right Deltoid. No Complaints very happy mood. NO SI/HI NOR AH/VH.

## 2023-06-17 NOTE — Patient Instructions (Addendum)
 Patient arrived for Q 28 DAY monthly injection-- ARIPiprazole  ER (ABILIFY  MAINTENA) 400 MG PRSY prefilled syringe . Tolerated well in Right Deltoid. No Complaints very happy mood. NO SI/HI NOR AH/VH.

## 2023-06-27 ENCOUNTER — Ambulatory Visit (HOSPITAL_COMMUNITY)
Admission: EM | Admit: 2023-06-27 | Discharge: 2023-06-28 | Disposition: A | Discharge status: Other Acute Inpatient Hospital | Payer: MEDICAID | Attending: Psychiatry | Admitting: Psychiatry

## 2023-06-27 DIAGNOSIS — F332 Major depressive disorder, recurrent severe without psychotic features: Secondary | ICD-10-CM | POA: Diagnosis not present

## 2023-06-27 DIAGNOSIS — R278 Other lack of coordination: Secondary | ICD-10-CM

## 2023-06-27 DIAGNOSIS — F603 Borderline personality disorder: Secondary | ICD-10-CM

## 2023-06-27 DIAGNOSIS — F649 Gender identity disorder, unspecified: Secondary | ICD-10-CM

## 2023-06-27 DIAGNOSIS — R45851 Suicidal ideations: Secondary | ICD-10-CM | POA: Diagnosis not present

## 2023-06-27 LAB — CBC WITH DIFFERENTIAL/PLATELET
Abs Immature Granulocytes: 0.03 10*3/uL (ref 0.00–0.07)
Basophils Absolute: 0 10*3/uL (ref 0.0–0.1)
Basophils Relative: 0 %
Eosinophils Absolute: 0.1 10*3/uL (ref 0.0–0.5)
Eosinophils Relative: 1 %
HCT: 36.9 % — ABNORMAL LOW (ref 39.0–52.0)
Hemoglobin: 12.9 g/dL — ABNORMAL LOW (ref 13.0–17.0)
Immature Granulocytes: 0 %
Lymphocytes Relative: 37 %
Lymphs Abs: 3 10*3/uL (ref 0.7–4.0)
MCH: 29.6 pg (ref 26.0–34.0)
MCHC: 35 g/dL (ref 30.0–36.0)
MCV: 84.6 fL (ref 80.0–100.0)
Monocytes Absolute: 0.6 10*3/uL (ref 0.1–1.0)
Monocytes Relative: 7 %
Neutro Abs: 4.6 10*3/uL (ref 1.7–7.7)
Neutrophils Relative %: 55 %
Platelets: 265 10*3/uL (ref 150–400)
RBC: 4.36 MIL/uL (ref 4.22–5.81)
RDW: 11.8 % (ref 11.5–15.5)
WBC: 8.3 10*3/uL (ref 4.0–10.5)
nRBC: 0 % (ref 0.0–0.2)

## 2023-06-27 LAB — LIPID PANEL
Cholesterol: 189 mg/dL (ref 0–200)
HDL: 36 mg/dL — ABNORMAL LOW (ref >40)
LDL Cholesterol: 136 mg/dL — ABNORMAL HIGH (ref 0–99)
Total CHOL/HDL Ratio: 5.3 ratio
Triglycerides: 85 mg/dL (ref <150)
VLDL: 17 mg/dL (ref 0–40)

## 2023-06-27 LAB — COMPREHENSIVE METABOLIC PANEL WITH GFR
ALT: 20 U/L (ref 0–44)
AST: 30 U/L (ref 15–41)
Albumin: 3.6 g/dL (ref 3.5–5.0)
Alkaline Phosphatase: 70 U/L (ref 38–126)
Anion gap: 9 (ref 5–15)
BUN: 10 mg/dL (ref 6–20)
CO2: 24 mmol/L (ref 22–32)
Calcium: 8.9 mg/dL (ref 8.9–10.3)
Chloride: 106 mmol/L (ref 98–111)
Creatinine, Ser: 0.52 mg/dL — ABNORMAL LOW (ref 0.61–1.24)
GFR, Estimated: >60 mL/min (ref >60)
Glucose, Bld: 80 mg/dL (ref 70–99)
Potassium: 4 mmol/L (ref 3.5–5.1)
Sodium: 139 mmol/L (ref 135–145)
Total Bilirubin: 0.3 mg/dL (ref 0.0–1.2)
Total Protein: 6.3 g/dL — ABNORMAL LOW (ref 6.5–8.1)

## 2023-06-27 LAB — POCT URINE DRUG SCREEN - MANUAL ENTRY (I-SCREEN)
POC Amphetamine UR: NOT DETECTED
POC Buprenorphine (BUP): NOT DETECTED
POC Cocaine UR: NOT DETECTED
POC Marijuana UR: NOT DETECTED
POC Methadone UR: NOT DETECTED
POC Methamphetamine UR: NOT DETECTED
POC Morphine: NOT DETECTED
POC Oxazepam (BZO): NOT DETECTED
POC Oxycodone UR: NOT DETECTED
POC Secobarbital (BAR): NOT DETECTED

## 2023-06-27 LAB — TSH: TSH: 2.879 u[IU]/mL (ref 0.350–4.500)

## 2023-06-27 LAB — ETHANOL: Alcohol, Ethyl (B): <15 mg/dL (ref <15)

## 2023-06-27 MED ORDER — DIPHENHYDRAMINE HCL 50 MG/ML IJ SOLN: 50.0000 mg | Freq: Three times a day (TID) | INTRAMUSCULAR | Status: DC | PRN

## 2023-06-27 MED ORDER — HALOPERIDOL LACTATE 5 MG/ML IJ SOLN: 10.0000 mg | Freq: Three times a day (TID) | INTRAMUSCULAR | Status: DC | PRN

## 2023-06-27 MED ORDER — HYDROXYZINE HCL 25 MG PO TABS: 25.0000 mg | ORAL_TABLET | Freq: Three times a day (TID) | ORAL | Status: DC | PRN

## 2023-06-27 MED ORDER — HALOPERIDOL LACTATE 5 MG/ML IJ SOLN: 5.0000 mg | Freq: Three times a day (TID) | INTRAMUSCULAR | Status: DC | PRN

## 2023-06-27 MED ORDER — METOPROLOL SUCCINATE ER 25 MG PO TB24: 12.5000 mg | ORAL_TABLET | Freq: Every day | ORAL | Status: DC

## 2023-06-27 MED ORDER — GUANFACINE HCL ER 3 MG PO TB24: 3.0000 mg | ORAL_TABLET | Freq: Every day | ORAL | Status: DC

## 2023-06-27 MED ORDER — DIPHENHYDRAMINE HCL 50 MG PO CAPS: 50.0000 mg | ORAL_CAPSULE | Freq: Three times a day (TID) | ORAL | Status: DC | PRN

## 2023-06-27 MED ORDER — ALUM & MAG HYDROXIDE-SIMETH 200-200-20 MG/5ML PO SUSP: 30.0000 mL | ORAL | Status: DC | PRN

## 2023-06-27 MED ORDER — TRAZODONE HCL 50 MG PO TABS
50.0000 mg | ORAL_TABLET | Freq: Every evening | ORAL | Status: DC | PRN
  Administered 2023-06-27: 50 mg via ORAL
  Filled 2023-06-27: qty 1

## 2023-06-27 MED ORDER — ACETAMINOPHEN 325 MG PO TABS: 650.0000 mg | ORAL_TABLET | Freq: Four times a day (QID) | ORAL | Status: DC | PRN

## 2023-06-27 MED ORDER — VENLAFAXINE HCL ER 150 MG PO CP24
150.0000 mg | ORAL_CAPSULE | Freq: Every day | ORAL | Status: DC
  Administered 2023-06-28: 150 mg via ORAL
  Filled 2023-06-27: qty 1

## 2023-06-27 MED ORDER — HALOPERIDOL 5 MG PO TABS: 5.0000 mg | ORAL_TABLET | Freq: Three times a day (TID) | ORAL | Status: DC | PRN

## 2023-06-27 MED ORDER — MONTELUKAST SODIUM 10 MG PO TABS
10.0000 mg | ORAL_TABLET | Freq: Every day | ORAL | Status: DC
  Administered 2023-06-27: 10 mg via ORAL
  Filled 2023-06-27: qty 1

## 2023-06-27 MED ORDER — GUANFACINE HCL ER 1 MG PO TB24
2.0000 mg | ORAL_TABLET | Freq: Every day | ORAL | Status: DC
  Administered 2023-06-27: 2 mg via ORAL
  Filled 2023-06-27: qty 2

## 2023-06-27 MED ORDER — LORAZEPAM 2 MG/ML IJ SOLN: 2.0000 mg | Freq: Three times a day (TID) | INTRAMUSCULAR | Status: DC | PRN

## 2023-06-27 MED ORDER — PROGESTERONE MICRONIZED 100 MG PO CAPS
200.0000 mg | ORAL_CAPSULE | Freq: Every day | ORAL | Status: DC
  Administered 2023-06-27: 200 mg via ORAL
  Filled 2023-06-27: qty 2

## 2023-06-27 MED ORDER — MAGNESIUM HYDROXIDE 400 MG/5ML PO SUSP: 30.0000 mL | Freq: Every day | ORAL | Status: DC | PRN
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
## 2023-06-27 NOTE — ED Notes (Signed)
 Patient A&Ox4. Patient was admitted due to suicidal ideation by overdose. Patient denies intent to harm self/others while in hospital. Denies A/VH. Patient denies any physical complaints when asked. No acute distress noted. Patient's skin assessment completed and oriented to the unit. Food and juice was provided per patient request.  Support and encouragement provided. Routine safety checks conducted according to facility protocol. Encouraged patient to notify staff if thoughts of harm toward self or others arise. Patient verbalize understanding and agreement. Patient now in bed appears asleep. Will continue to monitor for safety.
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
## 2023-06-28 ENCOUNTER — Other Ambulatory Visit: Payer: Self-pay

## 2023-06-28 ENCOUNTER — Encounter: Payer: Self-pay | Admitting: Psychiatry

## 2023-06-28 ENCOUNTER — Inpatient Hospital Stay
Admission: RE | Admit: 2023-06-28 | Discharge: 2023-07-02 | DRG: 881 | Disposition: A | Discharge status: Home / Self Care | Payer: MEDICAID | Source: Intra-hospital | Attending: Psychiatry | Admitting: Psychiatry

## 2023-06-28 DIAGNOSIS — Z8249 Family history of ischemic heart disease and other diseases of the circulatory system: Secondary | ICD-10-CM

## 2023-06-28 DIAGNOSIS — F411 Generalized anxiety disorder: Secondary | ICD-10-CM | POA: Diagnosis present

## 2023-06-28 DIAGNOSIS — Z79899 Other long term (current) drug therapy: Secondary | ICD-10-CM | POA: Diagnosis not present

## 2023-06-28 DIAGNOSIS — F4310 Post-traumatic stress disorder, unspecified: Secondary | ICD-10-CM | POA: Diagnosis present

## 2023-06-28 DIAGNOSIS — F339 Major depressive disorder, recurrent, unspecified: Secondary | ICD-10-CM | POA: Diagnosis not present

## 2023-06-28 DIAGNOSIS — F32A Depression, unspecified: Secondary | ICD-10-CM | POA: Diagnosis present

## 2023-06-28 DIAGNOSIS — F603 Borderline personality disorder: Secondary | ICD-10-CM | POA: Diagnosis present

## 2023-06-28 DIAGNOSIS — R45851 Suicidal ideations: Secondary | ICD-10-CM | POA: Diagnosis present

## 2023-06-28 DIAGNOSIS — Z9151 Personal history of suicidal behavior: Secondary | ICD-10-CM | POA: Diagnosis not present

## 2023-06-28 DIAGNOSIS — B356 Tinea cruris: Secondary | ICD-10-CM | POA: Diagnosis not present

## 2023-06-28 DIAGNOSIS — Z87891 Personal history of nicotine dependence: Secondary | ICD-10-CM | POA: Diagnosis not present

## 2023-06-28 DIAGNOSIS — Z62810 Personal history of physical and sexual abuse in childhood: Secondary | ICD-10-CM

## 2023-06-28 DIAGNOSIS — F902 Attention-deficit hyperactivity disorder, combined type: Secondary | ICD-10-CM | POA: Diagnosis present

## 2023-06-28 DIAGNOSIS — F329 Major depressive disorder, single episode, unspecified: Secondary | ICD-10-CM | POA: Diagnosis present

## 2023-06-28 DIAGNOSIS — Z818 Family history of other mental and behavioral disorders: Secondary | ICD-10-CM | POA: Diagnosis not present

## 2023-06-28 LAB — HEMOGLOBIN A1C
Hgb A1c MFr Bld: 5.1 % (ref 4.8–5.6)
Mean Plasma Glucose: 100 mg/dL

## 2023-06-28 MED ORDER — ALUM & MAG HYDROXIDE-SIMETH 200-200-20 MG/5ML PO SUSP: 30.0000 mL | ORAL | Status: DC | PRN

## 2023-06-28 MED ORDER — MAGNESIUM HYDROXIDE 400 MG/5ML PO SUSP: 30.0000 mL | Freq: Every day | ORAL | Status: DC | PRN

## 2023-06-28 MED ORDER — OLANZAPINE 10 MG IM SOLR: 5.0000 mg | Freq: Three times a day (TID) | INTRAMUSCULAR | Status: DC | PRN

## 2023-06-28 MED ORDER — HALOPERIDOL 5 MG PO TABS
5.0000 mg | ORAL_TABLET | Freq: Three times a day (TID) | ORAL | Status: DC | PRN
Start: 2023-06-28 — End: 2023-07-02

## 2023-06-28 MED ORDER — OLANZAPINE 10 MG IM SOLR: 10.0000 mg | Freq: Three times a day (TID) | INTRAMUSCULAR | Status: DC | PRN

## 2023-06-28 MED ORDER — GUANFACINE HCL ER 2 MG PO TB24
4.0000 mg | ORAL_TABLET | Freq: Every day | ORAL | Status: DC
  Administered 2023-06-28: 4 mg via ORAL
  Filled 2023-06-28: qty 2

## 2023-06-28 MED ORDER — HYDROXYZINE HCL 25 MG PO TABS
25.0000 mg | ORAL_TABLET | Freq: Three times a day (TID) | ORAL | Status: DC | PRN
Start: 2023-06-28 — End: 2023-07-02
  Administered 2023-06-30 – 2023-07-01 (×2): 25 mg via ORAL
  Filled 2023-06-28 (×2): qty 1

## 2023-06-28 MED ORDER — TRAZODONE HCL 50 MG PO TABS
50.0000 mg | ORAL_TABLET | Freq: Every evening | ORAL | Status: DC | PRN
  Administered 2023-06-28 – 2023-06-30 (×3): 50 mg via ORAL
  Filled 2023-06-28 (×3): qty 1

## 2023-06-28 MED ORDER — ACETAMINOPHEN 325 MG PO TABS
650.0000 mg | ORAL_TABLET | Freq: Four times a day (QID) | ORAL | Status: DC | PRN
  Administered 2023-06-28: 650 mg via ORAL
  Filled 2023-06-28: qty 2

## 2023-06-28 MED ORDER — DIPHENHYDRAMINE HCL 25 MG PO CAPS: 50.0000 mg | ORAL_CAPSULE | Freq: Three times a day (TID) | ORAL | Status: DC | PRN

## 2023-06-28 NOTE — Group Note (Signed)
 Date:  06/28/2023 Time:  8:38 PM  Group Topic/Focus:  Orientation:   The focus of this group is to educate the patient on the purpose and policies of crisis stabilization and provide a format to answer questions about their admission.  The group details unit policies and expectations of patients while admitted.    Participation Level:  Minimal  Participation Quality:  Appropriate and Attentive  Affect:  Appropriate  Cognitive:  Alert and Appropriate  Insight: Appropriate  Engagement in Group:  Improving  Modes of Intervention:  Clarification, Discussion, Education, Orientation, Rapport Building, and Support  Additional Comments:     Scott [name] 06/28/2023, 8:38 PM

## 2023-06-28 NOTE — ED Notes (Signed)
 Pt is currently asleep, resp are even and unlabored. There are no apparent s/sx of distress. No further concerns at this time.

## 2023-06-28 NOTE — Plan of Care (Signed)
   Problem: Education: Goal: Knowledge of [place] [facility] Education information/materials will improve Outcome: Progressing   Problem: Safety: Goal: Periods of time without injury will increase Outcome: Progressing

## 2023-06-28 NOTE — Group Note (Signed)
 [facility] LCSW Group Therapy Note    Group Date: 06/28/2023 Start Time: 1300 End Time: 1345  Type of Therapy and Topic:  Group Therapy:  Overcoming Obstacles  Participation Level:  [facility] PARTICIPATION LEVEL: Minimal   Description of Group:   In this group patients will be encouraged to explore what they see as obstacles to their own wellness and recovery. They will be guided to discuss their thoughts, feelings, and behaviors related to these obstacles. The group will process together ways to cope with barriers, with attention given to specific choices patients can make. Each patient will be challenged to identify changes they are motivated to make in order to overcome their obstacles. This group will be process-oriented, with patients participating in exploration of their own experiences as well as giving and receiving support and challenge from other group members.  Therapeutic Goals: 1. Patient will identify personal and current obstacles as they relate to admission. 2. Patient will identify barriers that currently interfere with their wellness or overcoming obstacles.  3. Patient will identify feelings, thought process and behaviors related to these barriers. 4. Patient will identify two changes they are willing to make to overcome these obstacles:    Summary of Patient Progress Patient came into group near the end. 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 Pt reported plans to start attending NA meetings and getting themselves a sponsor. She has some insight into herself. Pt appeared open and receptive to feedback/comments from both her peers and facilitator.    Therapeutic Modalities:   Cognitive Behavioral Therapy Solution Focused Therapy Motivational Interviewing Relapse Prevention Therapy   Randolm [place], LCSW

## 2023-06-28 NOTE — ED Notes (Addendum)
 Pt being transported to accepting facility [facility]) now via [facility]. All belongings given to transport service. Pt remains stable, calm and cooperative. No s/sx of distress. Any questions regarding transfer answered. All paperwork present in folder, also being given to transport service. No further concerns.

## 2023-06-28 NOTE — Tx Team (Signed)
 Initial Treatment Plan 06/28/2023 2:15 PM SHINE ANTAL ZOX:[id number]    PATIENT STRESSORS: Financial difficulties   Loss of friendship   Medication change or noncompliance     PATIENT STRENGTHS: Active sense of humor  Supportive family/friends    PATIENT IDENTIFIED PROBLEMS: Depression  Anxiety                   DISCHARGE CRITERIA:  Improved stabilization in mood, thinking, and/or behavior Verbal commitment to aftercare and medication compliance  PRELIMINARY DISCHARGE PLAN: Return to previous living arrangement  PATIENT/FAMILY INVOLVEMENT: This treatment plan has been presented to and reviewed with the patient, Scott [name].The patient has been given the opportunity to ask questions and make suggestions.  Rodney Wigger, RN 06/28/2023, 2:15 PM

## 2023-06-28 NOTE — ED Notes (Addendum)
 Report called to Rice Chamorro, RN. [facility] called.

## 2023-06-28 NOTE — Progress Notes (Signed)
 Pt was accepted to [facility]  06/28/2023 Bed Assignment 315   Address: [address] [place], [place], [place] [address]  BMU FAX Number [phone]  Pt meets inpatient criteria per: Fain Home NP   Attending Physician will be Dr. Romona Cobb  Report can be called to: [phone]  Pt can arrive after discharges   Care Team notified: Kathryn Parish RN, Zaira Mitchell RN, Debbra Fairy RN, Lamon Pillow RN, Angela McLauchlin NP  [place] Colston Pyle, MSW, [facility] 06/28/2023 10:38 AM

## 2023-06-28 NOTE — Group Note (Signed)
 Recreation Therapy Group Note   Group Topic:Self-Esteem  Group Date: 06/28/2023 Start Time: 1030 End Time: 1120 Facilitators: Deatrice Factor, LRT, CTRS Location: Craft Room  Group Description: Positive Affirmation Worksheet. Patients and LRT discussed the importance of self-love/self-esteem and things that cause it to fluctuate, including our mental health. Patients completed a worksheet that helps them identify 24 different strengths and qualities about themselves. Pt encouraged to read aloud at least 3 off their sheet to the group. LRT and pts discussed how this can be applied to daily life post-discharge. After completing worksheet, patients played Positive Affirmation Bingo and won stress balls, candy, or an activity book as a prize.   Goal Area(s) Addressed: Patient will identify positive qualities about themselves. Patient will learn new positive affirmations.  Patient will recite positive qualities and affirmations aloud to the group.  Patient will practice positive self-talk.  Patient will increase communication.   Affect/Mood: N/A   Participation Level: Did not attend    Clinical Observations/Individualized Feedback: Patient was not on the unit at the time of group.   Plan: Continue to engage patient in RT group sessions 2-3x/week.   Deatrice Factor, LRT, CTRS 06/28/2023 1:24 PM

## 2023-06-28 NOTE — Progress Notes (Signed)
 Pt calm and pleasant during assessment denying SI/HI/AVH. Pt endorses depression. Pt observed by this Clinical research associate interacting appropriately with staff and peers on the unit. Pt compliant with medication administration per MD orders. Pt given education, support, and encouragement to be active in his treatment plan. Pt being monitored Q 15 minutes for safety per unit protocol, remains safe on the unit
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
## 2023-06-28 NOTE — ED Notes (Signed)
 Pt signed voluntary econsent.
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
## 2023-06-29 DIAGNOSIS — F411 Generalized anxiety disorder: Secondary | ICD-10-CM

## 2023-06-29 DIAGNOSIS — F4310 Post-traumatic stress disorder, unspecified: Secondary | ICD-10-CM

## 2023-06-29 DIAGNOSIS — F603 Borderline personality disorder: Secondary | ICD-10-CM

## 2023-06-29 DIAGNOSIS — F339 Major depressive disorder, recurrent, unspecified: Secondary | ICD-10-CM

## 2023-06-29 MED ORDER — NON FORMULARY: 150.0000 mg | Freq: Every day | Status: DC

## 2023-06-29 MED ORDER — VENLAFAXINE HCL ER 75 MG PO CP24
150.0000 mg | ORAL_CAPSULE | Freq: Every day | ORAL | Status: DC
  Administered 2023-06-30 – 2023-07-02 (×3): 150 mg via ORAL
  Filled 2023-06-29 (×3): qty 2

## 2023-06-29 MED ORDER — GUANFACINE HCL ER 1 MG PO TB24
2.0000 mg | ORAL_TABLET | Freq: Every day | ORAL | Status: DC
  Administered 2023-06-29 – 2023-07-02 (×4): 2 mg via ORAL
  Filled 2023-06-29 (×4): qty 2
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
## 2023-06-29 NOTE — Group Note (Signed)
 [facility] LCSW Group Therapy Note   Group Date: 06/29/2023 Start Time: 1300 End Time: 1420   Type of Therapy/Topic:  Group Therapy:  Emotion Regulation  Participation Level:  Active   Mood:  Description of Group:    The purpose of this group is to assist patients in learning to regulate negative emotions and experience positive emotions. Patients will be guided to discuss ways in which they have been vulnerable to their negative emotions. These vulnerabilities will be juxtaposed with experiences of positive emotions or situations, and patients challenged to use positive emotions to combat negative ones. Special emphasis will be placed on coping with negative emotions in conflict situations, and patients will process healthy conflict resolution skills.  Therapeutic Goals: Patient will identify two positive emotions or experiences to reflect on in order to balance out negative emotions:  Patient will label two or more emotions that they find the most difficult to experience:  Patient will be able to demonstrate positive conflict resolution skills through discussion or role plays:   Summary of Patient Progress:  The patient actively participated in the group, openly sharing their struggles with navigating distorted thinking. Together with the group, they brainstormed strategies to challenge negative thoughts in order to improve their emotions and, ultimately, their behaviors. The patient also explored ways to better cope with complex feelings, encouraging both themselves and others to consider options that lead to healthier ways of handling difficult situations. The patient was attentive to both the group facilitator and peers, asking relevant questions that contributed to the discussion. They also shared her perspectives, helping to advance the conversation and deepen understanding.    Therapeutic Modalities:   Cognitive Behavioral Therapy Feelings Identification Dialectical Behavioral  Therapy   Roselle Conner, LCSW
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
## 2023-06-29 NOTE — Group Note (Unsigned)
 Date:  06/29/2023 Time:  8:49 PM  Group Topic/Focus:  Wrap-Up Group:   The focus of this group is to help patients review their daily goal of treatment and discuss progress on daily workbooks.     Participation Level:  {BHH PARTICIPATION ZOXWR:60454}  Participation Quality:  {BHH PARTICIPATION QUALITY:22265}  Affect:  {BHH AFFECT:22266}  Cognitive:  {BHH COGNITIVE:22267}  Insight: {BHH Insight2:20797}  Engagement in Group:  {BHH ENGAGEMENT IN UJWJX:91478}  Modes of Intervention:  {BHH MODES OF INTERVENTION:22269}  Additional Comments:  ***  Fabiola Holy 06/29/2023, 8:49 PM

## 2023-06-29 NOTE — Plan of Care (Signed)
   Problem: Education: Goal: Emotional status will improve Outcome: Progressing Goal: Mental status will improve Outcome: Progressing

## 2023-06-29 NOTE — Progress Notes (Signed)
 Pt calm and pleasant during assessment denying SI/HI/AVH. Pt endorses depression. Pt observed by this Clinical research associate interacting appropriately with staff and peers on the unit. Pt compliant with medication administration per MD orders. Pt given education, support, and encouragement to be active in his treatment plan. Pt being monitored Q 15 minutes for safety per unit protocol, remains safe on the unit

## 2023-06-29 NOTE — Plan of Care (Signed)
  Problem: Education: Goal: Emotional status will improve Outcome: Progressing   Problem: Activity: Goal: Interest or engagement in activities will improve Outcome: Progressing   Problem: Coping: Goal: Ability to verbalize frustrations and anger appropriately will improve Outcome: Progressing   Problem: Physical Regulation: Goal: Ability to maintain clinical measurements within normal limits will improve Outcome: Progressing   Problem: Safety: Goal: Periods of time without injury will increase Outcome: Progressing

## 2023-06-29 NOTE — Group Note (Signed)
 Date:  06/29/2023 Time:  4:35 PM  Group Topic/Focus:  Activity Group: The focus of the group is to promote activity for the patients and encourage them to go outside to the courtyard and get some fresh air and some exercise.    Participation Level:  Active  Participation Quality:  Appropriate  Affect:  Appropriate  Cognitive:  Appropriate  Insight: Appropriate  Engagement in Group:  Engaged  Modes of Intervention:  Activity  Additional Comments:    Marianna Shirk Javier Gell 06/29/2023, 4:35 PM

## 2023-06-29 NOTE — Progress Notes (Signed)
   06/29/23 1000  Psych Admission Type (Psych Patients Only)  Admission Status Voluntary  Psychosocial Assessment  Patient Complaints Other (Comment) (Patient likes to get his regular medicine.)  Eye Contact Fair  Facial Expression Animated  Affect Appropriate to circumstance  Speech Logical/coherent  Interaction Assertive  Motor Activity Slow  Appearance/Hygiene Unremarkable  Behavior Characteristics Cooperative  Mood Depressed;Pleasant  Thought Process  Coherency WDL  Content WDL  Delusions None reported or observed  Perception WDL  Hallucination None reported or observed  Judgment Impaired  Confusion None  Danger to Self  Current suicidal ideation? Denies  Danger to Others  Danger to Others None reported or observed   Patient states goal for today is " not to sleep all day." Patient states that she feels tired and need to take a nap. Support and encouragement given.

## 2023-06-29 NOTE — Group Note (Signed)
 Recreation Therapy Group Note   Group Topic:Goal Setting  Group Date: 06/29/2023 Start Time: 1000 End Time: 1055 Facilitators: Deatrice Factor, LRT, CTRS Location: Craft Room  Group Description: Product/process development scientist. Patients were given many different magazines, a glue stick, markers, and a piece of cardstock paper. LRT and pts discussed the importance of having goals in life. LRT and pts discussed the difference between short-term and long-term goals, as well as what a SMART goal is. LRT encouraged pts to create a vision board, with images they picked and then cut out with safety scissors from the magazine, for themselves, that capture their short and long-term goals. LRT encouraged pts to show and explain their vision board to the group.   Goal Area(s) Addressed:  Patient will gain knowledge of short vs. long term goals.  Patient will identify goals for themselves. Patient will practice setting SMART goals. Patient will verbalize their goals to LRT and peers.   Affect/Mood: Flat   Participation Level: Did not attend    Clinical Observations/Individualized Feedback: Scott [name] was originally present in group. Pt shared that they are "feeling tired and drained" and said they are going to go lay down in their room. Pt asked and received an information handout on SMART goals.   Plan: Continue to engage patient in RT group sessions 2-3x/week.   Deatrice Factor, LRT, CTRS 06/29/2023 1:09 PM

## 2023-06-29 NOTE — Group Note (Signed)
 Date:  06/29/2023 Time:  9:51 PM  Group Topic/Focus:  Wrap-Up Group:   The focus of this group is to help patients review their daily goal of treatment and discuss progress on daily workbooks.    Participation Level:  Active  Participation Quality:  Attentive and Sharing  Affect:  Appropriate  Cognitive:  Appropriate  Insight: Appropriate  Engagement in Group:  Engaged  Modes of Intervention:  Discussion  Additional Comments:     Scott [name] 06/29/2023, 9:51 PM

## 2023-06-29 NOTE — Group Note (Signed)
 Date:  06/29/2023 Time:  10:09 AM  Group Topic/Focus:  Healthy Communication:   The focus of this group is to discuss communication, barriers to communication, as well as healthy ways to communicate with others.    Participation Level:  Active  Participation Quality:  Appropriate  Affect:  Appropriate  Cognitive:  Appropriate  Insight: Appropriate  Engagement in Group:  Engaged  Modes of Intervention:  Activity  Additional Comments:    Marianna Shirk Aida Lemaire 06/29/2023, 10:09 AM

## 2023-06-30 MED ORDER — TERBINAFINE HCL 1 % EX CREA
TOPICAL_CREAM | Freq: Two times a day (BID) | CUTANEOUS | Status: DC
  Filled 2023-06-30: qty 12

## 2023-06-30 NOTE — [facility] Counselor (Signed)
 CSW touched base with [facility] in [place] to schedule patient's follow up appointments and was informed that patient has a CST team through [facility].   CSW was given the patient's CST leader, . Ledon Pry Mobley's information: [phone].   Ms. Orbie Binder confirmed that the CST team will follow up with patient after discharge on 07/02/23 for therapy.   CSW was informed that [facility] offers medication management as well. Ms. Orbie Binder wanted CSW to touch base with patient to assess if would be better continuum of care for the patient to do medication management through her CST team versus her current medication management program.   CSW touched base with patient who reports that she would rather do medication management with her CST team moving forward.   This has been communicated to Ms. Mobley. Mobley requested documentation be sent on the patient's behalf to email [email or web address] to make medication management referral.   CSW has sent requested information with patient's signed consent. Medication management appointment will take place 07/07/23. CST team and patient to confirm time during discharge follow up appointment on 07/02/23.   This has been communicated to team to continue to plan for safe discharge.   CSW team  to continue to assess.   Beatriz Quintela, MSW, LCSWA 06/30/2023 1:58 PM

## 2023-06-30 NOTE — Plan of Care (Signed)
   Problem: Education: Goal: Emotional status will improve Outcome: Progressing Goal: Mental status will improve Outcome: Progressing

## 2023-06-30 NOTE — BH IP Treatment Plan (Signed)
 Interdisciplinary Treatment and Diagnostic Plan Update  06/30/2023 Time of Session: 10:09AM MATHER KLADIS MRN: [id number]  Principal Diagnosis: Borderline personality disorder [facility])  Secondary Diagnoses: Principal Problem:   Borderline personality disorder (HCC) Active Problems:   PTSD (post-traumatic stress disorder)   MDD (major depressive disorder)   GAD (generalized anxiety disorder)   Current Medications:  Current Facility-Administered Medications  Medication Dose Route Frequency Provider Last Rate Last Admin   acetaminophen  (TYLENOL ) tablet 650 mg  650 mg Oral Q6H PRN McLauchlin, Angela, NP   650 mg at 06/28/23 1726   alum & mag hydroxide-simeth (MAALOX/MYLANTA) 200-200-20 MG/5ML suspension 30 mL  30 mL Oral Q4H PRN McLauchlin, Angela, NP       haloperidol (HALDOL) tablet 5 mg  5 mg Oral TID PRN McLauchlin, Angela, NP       And   diphenhydrAMINE (BENADRYL) capsule 50 mg  50 mg Oral TID PRN McLauchlin, Angela, NP       guanFACINE  (INTUNIV ) ER tablet 2 mg  2 mg Oral Daily Millington, Matthew E, PA-C   2 mg at 06/30/23 2595   hydrOXYzine  (ATARAX ) tablet 25 mg  25 mg Oral TID PRN McLauchlin, Angela, NP       magnesium  hydroxide (MILK OF MAGNESIA) suspension 30 mL  30 mL Oral Daily PRN McLauchlin, Angela, NP       OLANZapine (ZYPREXA) injection 10 mg  10 mg Intramuscular TID PRN McLauchlin, Angela, NP       OLANZapine (ZYPREXA) injection 5 mg  5 mg Intramuscular TID PRN McLauchlin, Angela, NP       traZODone  (DESYREL ) tablet 50 mg  50 mg Oral QHS PRN McLauchlin, Angela, NP   50 mg at 06/29/23 2041   venlafaxine  XR (EFFEXOR -XR) 24 hr capsule 150 mg  150 mg Oral Q breakfast Millington, Matthew E, PA-C   150 mg at 06/30/23 6387   PTA Medications: Facility-Administered Medications Prior to Admission  Medication Dose Route Frequency Provider Last Rate Last Admin   ARIPiprazole  ER (ABILIFY  MAINTENA) 400 MG prefilled syringe 400 mg  400 mg Intramuscular Q28 days Akhtar, Nadeem, MD    400 mg at 06/17/23 0947   Medications Prior to Admission  Medication Sig Dispense Refill Last Dose/Taking   ARIPiprazole  ER (ABILIFY  MAINTENA) 400 MG PRSY prefilled syringe Inject 400 mg into the muscle every 28 (twenty-eight) days. 1 each 12 Past Month   desvenlafaxine  (PRISTIQ ) 50 MG 24 hr tablet Take 3 tablets (150 mg total) by mouth daily. 90 tablet 1 Past Week   guanFACINE  (INTUNIV ) 2 MG TB24 ER tablet Take 4 mg by mouth daily.   Past Week   ciprofloxacin-dexamethasone  (CIPRODEX) OTIC suspension Place 4 drops into the left ear 2 (two) times daily.      estradiol  valerate (DELESTROGEN ) 20 MG/ML injection Inject 20 mg into the muscle every Sunday.      fluconazole (DIFLUCAN) 100 MG tablet Take 100 mg by mouth daily. Take for 10 days starting on 06/22/23. (Patient not taking: Reported on 06/28/2023)      hydrOXYzine  (ATARAX ) 25 MG tablet Take 1 tablet (25 mg total) by mouth daily as needed for anxiety. (Patient not taking: Reported on 06/28/2023) 30 tablet 1    montelukast  (SINGULAIR ) 10 MG tablet Take 10 mg by mouth at bedtime.      progesterone  (PROMETRIUM ) 200 MG capsule Take 200 mg by mouth at bedtime.      traZODone  (DESYREL ) 50 MG tablet Take 1 tablet (50 mg total) by mouth at bedtime as  needed for sleep.      triamcinolone  ointment (KENALOG ) 0.5 % Apply 1 application. topically 2 (two) times daily. (Patient taking differently: Apply 1 application  topically 2 (two) times daily as needed (For eczema).) 60 g 3     Patient Stressors: Financial difficulties   Loss of friendship   Medication change or noncompliance    Patient Strengths: Active sense of humor  Supportive family/friends   Treatment Modalities: Medication Management, Group therapy, Case management,  1 to 1 session with clinician, Psychoeducation, Recreational therapy.   Physician Treatment Plan for Primary Diagnosis: Borderline personality disorder (HCC) Long Term Goal(s): Improvement in symptoms so as ready for discharge    Short Term Goals: Ability to identify changes in lifestyle to reduce recurrence of condition will improve Ability to verbalize feelings will improve Ability to demonstrate self-control will improve Ability to maintain clinical measurements within normal limits will improve Ability to identify triggers associated with substance abuse/mental health issues will improve Ability to disclose and discuss suicidal ideas  Medication Management: Evaluate patient's response, side effects, and tolerance of medication regimen.  Therapeutic Interventions: 1 to 1 sessions, Unit Group sessions and Medication administration.  Evaluation of Outcomes: Progressing  Physician Treatment Plan for Secondary Diagnosis: Principal Problem:   Borderline personality disorder (HCC) Active Problems:   PTSD (post-traumatic stress disorder)   MDD (major depressive disorder)   GAD (generalized anxiety disorder)  Long Term Goal(s): Improvement in symptoms so as ready for discharge   Short Term Goals: Ability to identify changes in lifestyle to reduce recurrence of condition will improve Ability to verbalize feelings will improve Ability to demonstrate self-control will improve Ability to maintain clinical measurements within normal limits will improve Ability to identify triggers associated with substance abuse/mental health issues will improve Ability to disclose and discuss suicidal ideas     Medication Management: Evaluate patient's response, side effects, and tolerance of medication regimen.  Therapeutic Interventions: 1 to 1 sessions, Unit Group sessions and Medication administration.  Evaluation of Outcomes: Progressing   RN Treatment Plan for Primary Diagnosis: Borderline personality disorder (HCC) Long Term Goal(s): Knowledge of disease and therapeutic regimen to maintain health will improve  Short Term Goals: Ability to demonstrate self-control, Ability to participate in decision making will  improve, Ability to verbalize feelings will improve, Ability to disclose and discuss suicidal ideas, Ability to identify and develop effective coping behaviors will improve, and Compliance with prescribed medications will improve  Medication Management: RN will administer medications as ordered by provider, will assess and evaluate patient's response and provide education to patient for prescribed medication. RN will report any adverse and/or side effects to prescribing provider.  Therapeutic Interventions: 1 on 1 counseling sessions, Psychoeducation, Medication administration, Evaluate responses to treatment, Monitor vital signs and CBGs as ordered, Perform/monitor CIWA, COWS, AIMS and Fall Risk screenings as ordered, Perform wound care treatments as ordered.  Evaluation of Outcomes: Progressing   LCSW Treatment Plan for Primary Diagnosis: Borderline personality disorder (HCC) Long Term Goal(s): Safe transition to appropriate next level of care at discharge, Engage patient in therapeutic group addressing interpersonal concerns.  Short Term Goals: Engage patient in aftercare planning with referrals and resources, Increase social support, Increase ability to appropriately verbalize feelings, Increase emotional regulation, Facilitate acceptance of mental health diagnosis and concerns, and Increase skills for wellness and recovery  Therapeutic Interventions: Assess for all discharge needs, 1 to 1 time with Social worker, Explore available resources and support systems, Assess for adequacy in community support network, Educate family  and significant other(s) on suicide prevention, Complete Psychosocial Assessment, Interpersonal group therapy.  Evaluation of Outcomes: Progressing   Progress in Treatment: Attending groups: Yes. Participating in groups: Yes. Taking medication as prescribed: Yes. Toleration medication: Yes. Family/Significant other contact made: No, will contact:  once permission has  been granted Patient understands diagnosis: Yes. Discussing patient identified problems/goals with staff: Yes. Medical problems stabilized or resolved: Yes. Denies suicidal/homicidal ideation: Yes. Issues/concerns per patient self-inventory: No. Other: none  New problem(s) identified: No, Describe:  none  New Short Term/Long Term Goal(s): detox, elimination of symptoms of psychosis, medication management for mood stabilization; elimination of SI thoughts; development of comprehensive mental wellness/sobriety plan.   Patient Goals:  "get myself stable"  Discharge Plan or Barriers: CSW to assist in the development of appropriate discharge plans.    Reason for Continuation of Hospitalization: Anxiety Depression Medication stabilization Suicidal ideation  Estimated Length of Stay:  1-7 days  Last 3 [place] Suicide Severity Risk Score: Flowsheet Row Admission (Current) from 06/28/2023 in [facility] ED from 06/27/2023 in [facility] Office Visit from 06/03/2023 in [facility] at [facility]  C-SSRS RISK CATEGORY Moderate Risk Moderate Risk No Risk       Last PHQ 2/9 Scores:    04/18/2023    5:27 AM 02/09/2023    9:22 AM 01/05/2023    1:25 PM  Depression screen PHQ 2/9  Decreased Interest 0 0 1  Down, Depressed, Hopeless 0 1 0  PHQ - 2 Score 0 1 1    Scribe for Treatment Team: Larri Ply, LCSW 06/30/2023 10:48 AM

## 2023-06-30 NOTE — Group Note (Signed)
 Date:  06/30/2023 Time:  9:26 PM  Group Topic/Focus:  Stages of Change:   The focus of this group is to explain the stages of change and help patients identify changes they want to make upon discharge.    Participation Level:  Active  Participation Quality:  Appropriate and Attentive  Affect:  Appropriate  Cognitive:  Alert and Appropriate  Insight: Appropriate, Good, and Improving  Engagement in Group:  Developing/Improving and Engaged  Modes of Intervention:  Activity, Discussion, Rapport Building, Reality Testing, and Support  Additional Comments:     Enedina Pair 06/30/2023, 9:26 PM
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
## 2023-06-30 NOTE — Plan of Care (Signed)
  Problem: Education: Goal: Mental status will improve Outcome: Progressing   Problem: Activity: Goal: Interest or engagement in activities will improve Outcome: Progressing   Problem: Coping: Goal: Ability to verbalize frustrations and anger appropriately will improve Outcome: Progressing   Problem: Physical Regulation: Goal: Ability to maintain clinical measurements within normal limits will improve Outcome: Progressing   Problem: Safety: Goal: Periods of time without injury will increase Outcome: Progressing

## 2023-06-30 NOTE — Group Note (Signed)
 [facility] LCSW Group Therapy Note   Group Date: 06/30/2023 Start Time: 1300 End Time: 1415   Type of Therapy/Topic:  Group Therapy:  Emotion Regulation  Participation Level:  Minimal   Mood:  Description of Group:    The purpose of this group is to assist patients in learning to regulate negative emotions and experience positive emotions. Patients will be guided to discuss ways in which they have been vulnerable to their negative emotions. These vulnerabilities will be juxtaposed with experiences of positive emotions or situations, and patients challenged to use positive emotions to combat negative ones. Special emphasis will be placed on coping with negative emotions in conflict situations, and patients will process healthy conflict resolution skills.  Therapeutic Goals: Patient will identify two positive emotions or experiences to reflect on in order to balance out negative emotions:  Patient will label two or more emotions that they find the most difficult to experience:  Patient will be able to demonstrate positive conflict resolution skills through discussion or role plays:   Summary of Patient Progress: Patient was present in group. Patient shared how his father has displayed inappropriate anger and how he has learned to express anger differently.      Therapeutic Modalities:   Cognitive Behavioral Therapy Feelings Identification Dialectical Behavioral Therapy   Scott Ply, LCSW

## 2023-06-30 NOTE — Group Note (Signed)
 Date:  06/30/2023 Time:  6:40 PM  Group Topic/Focus:  Making Healthy Choices:   The focus of this group is to help patients identify negative/unhealthy choices they were using prior to admission and identify positive/healthier coping strategies to replace them upon discharge.    Participation Level:  Active  Participation Quality:  Appropriate  Affect:  Appropriate  Cognitive:  Appropriate  Insight: Appropriate  Engagement in Group:  Engaged  Modes of Intervention:  Activity  Additional Comments:    Scott [name] 06/30/2023, 6:40 PM

## 2023-06-30 NOTE — [facility] Suicide Risk Assessment (Signed)
 [facility] INPATIENT:  Family/Significant Other Suicide Prevention Education  Suicide Prevention Education:  Education Completed; [name]/roommate [phone]), has been identified by the patient as the family member/significant other with whom the patient will be residing, and identified as the person(s) who will aid the patient in the event of a mental health crisis (suicidal ideations/suicide attempt).  With written consent from the patient, the family member/significant other has been provided the following suicide prevention education, prior to the and/or following the discharge of the patient.  The suicide prevention education provided includes the following: Suicide risk factors Suicide prevention and interventions National Suicide Hotline telephone number [facility] assessment telephone number [facility] Emergency Assistance 911 [facility] and/or Residential Mobile Crisis Unit telephone number  Request made of family/significant other to: Remove weapons (e.g., guns, rifles, knives), all items previously/currently identified as safety concern.   Remove drugs/medications (over-the-counter, prescriptions, illicit drugs), all items previously/currently identified as a safety concern.  The family member/significant other verbalizes understanding of the suicide prevention education information provided.  The family member/significant other agrees to remove the items of safety concern listed above.  Scott [name] shared that pt had informed her that pt was depressed and felt the need to return to the hospital. Scott [name] denied feeling that pt was a danger to themselves or anyone else and denied pt having access to any weapons.   Scott [name] 06/30/2023, 2:03 PM

## 2023-06-30 NOTE — Progress Notes (Signed)
   06/30/23 0900  Psych Admission Type (Psych Patients Only)  Admission Status Voluntary  Psychosocial Assessment  Patient Complaints Other (Comment) (" feels tired.")  Eye Contact Fair  Facial Expression Animated  Affect Appropriate to circumstance  Speech Logical/coherent  Interaction Assertive  Motor Activity Slow  Appearance/Hygiene Unremarkable  Behavior Characteristics Cooperative  Mood Pleasant  Thought Process  Coherency WDL  Content WDL  Delusions None reported or observed  Perception WDL  Hallucination None reported or observed  Judgment Impaired  Confusion None  Danger to Self  Current suicidal ideation? Denies  Danger to Others  Danger to Others None reported or observed
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
## 2023-07-01 NOTE — Group Note (Signed)
 [facility] LCSW Group Therapy Note   Group Date: 07/01/2023 Start Time: 1300 End Time: 1400   Type of Therapy/Topic:  Group Therapy:  Balance in Life  Participation Level:  Active   Description of Group:    This group will address the concept of balance and how it feels and looks when one is unbalanced. Patients will be encouraged to process areas in their lives that are out of balance, and identify reasons for remaining unbalanced. Facilitators will guide patients utilizing problem- solving interventions to address and correct the stressor making their life unbalanced. Understanding and applying boundaries will be explored and addressed for obtaining  and maintaining a balanced life. Patients will be encouraged to explore ways to assertively make their unbalanced needs known to significant others in their lives, using other group members and facilitator for support and feedback.  Therapeutic Goals: Patient will identify two or more emotions or situations they have that consume much of in their lives. Patient will identify signs/triggers that life has become out of balance:  Patient will identify two ways to set boundaries in order to achieve balance in their lives:  Patient will demonstrate ability to communicate their needs through discussion and/or role plays  Summary of Patient Progress: Patient was present for the majority of the group process. She participated in the discussion and brought lots of knowledge about sobriety support groups to the conversation. Pt appeared to have some insight into the topic and herself. She appeared open and receptive to feedback/comments from both her peers and facilitator.    Therapeutic Modalities:   Cognitive Behavioral Therapy Solution-Focused Therapy Assertiveness Training   Randolm Butte, LCSW

## 2023-07-01 NOTE — Progress Notes (Signed)
   06/30/23 1900  Psych Admission Type (Psych Patients Only)  Admission Status Voluntary  Psychosocial Assessment  Patient Complaints None  Eye Contact Fair  Facial Expression Animated  Affect Appropriate to circumstance  Speech Logical/coherent  Interaction Assertive  Motor Activity Slow  Appearance/Hygiene Unremarkable  Behavior Characteristics Cooperative  Mood Pleasant  Aggressive Behavior  Effect No apparent injury  Thought Process  Coherency WDL  Content WDL  Delusions None reported or observed  Perception WDL  Hallucination None reported or observed  Judgment Impaired  Confusion None  Danger to Self  Current suicidal ideation? Denies  Danger to Others  Danger to Others None reported or observed

## 2023-07-01 NOTE — Plan of Care (Signed)
Problem: Education: Goal: Emotional status will improve Outcome: Progressing Goal: Mental status will improve Outcome: Progressing   Problem: Activity: Goal: Interest or engagement in activities will improve Outcome: Progressing Goal: Sleeping patterns will improve Outcome: Progressing   Problem: Health Behavior/Discharge Planning: Goal: Compliance with treatment plan for underlying cause of condition will improve Outcome: Progressing   Problem: Safety: Goal: Periods of time without injury will increase Outcome: Progressing

## 2023-07-01 NOTE — Group Note (Signed)
 Date:  07/01/2023 Time:  9:58 AM  Group Topic/Focus:  Goals Group:   The focus of this group is to help patients establish daily goals to achieve during treatment and discuss how the patient can incorporate goal setting into their daily lives to aide in recovery.   Participation Level:  Active  Participation Quality:  Appropriate  Affect:  Appropriate  Cognitive:  Appropriate  Insight: Appropriate  Engagement in Group:  Engaged  Modes of Intervention:  Discussion and Education  Additional Comments:    Scott [name] A Hieu Herms 07/01/2023, 9:58 AM

## 2023-07-01 NOTE — Plan of Care (Signed)
   Problem: Education: Goal: Knowledge of [facility] Education information/materials will improve Outcome: Progressing Goal: Emotional status will improve Outcome: Progressing Goal: Mental status will improve Outcome: Progressing

## 2023-07-01 NOTE — Plan of Care (Signed)
  Problem: Activity: Goal: Interest or engagement in activities will improve Outcome: Progressing Goal: Sleeping patterns will improve Outcome: Progressing   Problem: Coping: Goal: Ability to verbalize frustrations and anger appropriately will improve Outcome: Progressing Goal: Ability to demonstrate self-control will improve Outcome: Progressing   Problem: Physical Regulation: Goal: Ability to maintain clinical measurements within normal limits will improve Outcome: Progressing   Problem: Safety: Goal: Periods of time without injury will increase Outcome: Progressing

## 2023-07-01 NOTE — Group Note (Signed)
 Recreation Therapy Group Note   Group Topic:Relaxation  Group Date: 07/01/2023 Start Time: 1000 End Time: 1045 Facilitators: Deatrice Factor, LRT, CTRS Location: Craft Room  Group Description: PMR (Progressive Muscle Relaxation). LRT asks patients their current level of stress/anxiety from 1-10, with 10 being the highest. LRT educates patients on what PMR is and the benefits that come from it. Patients are asked to sit with their feet flat on the floor while sitting up and all the way back in their chair, if possible. LRT and pts follow a prompt through a speaker that requires you to tense and release different muscles in their body and focus on their breathing. During session, lights are off and soft music is being played. Pts are given a stress ball to use if needed. At the end of the prompt, LRT asks patients to rank their current levels of stress/anxiety from 1-10, 10 being the highest. LRT provides patients with an education handout on PMR.   Goal Area(s) Addressed:  Patients will be able to describe progressive muscle relaxation.  Patient will practice using relaxation technique. Patient will identify a new coping skill.  Patient will follow multistep directions to reduce anxiety and stress.   Affect/Mood: Appropriate   Participation Level: Active and Engaged   Participation Quality: Independent   Behavior: Appropriate, Calm, and Cooperative   Speech/Thought Process: Coherent   Insight: Good   Judgement: Good   Modes of Intervention: Education and Exploration   Patient Response to Interventions:  Attentive, Engaged, and Receptive   Education Outcome:  Acknowledges education   Clinical Observations/Individualized Feedback: Scott [name] was active in their participation of session activities and group discussion. Pt identified that her anxiety was a 1 and stress was a 3 before the session. Afterwards, she rated her anxiety and stress both a 1.    Plan: Continue to engage  patient in RT group sessions 2-3x/week.   Deatrice Factor, LRT, CTRS 07/01/2023 1:19 PM

## 2023-07-01 NOTE — Progress Notes (Signed)
 Patient presents in animated mood and states she slept well. Patient denies SI,HI, and A/V/H with no plan or intent. Patient states her depression is a 3 out of 10 and anxiety 5 out of 10. Patient received hydroxyzine  po prn due to anxiety which provided relief. Patient denies any pain or discomfort and states her goal for today is arranging her discharge. Patient remains cooperative and engaged in unit.

## 2023-07-02 MED ORDER — VENLAFAXINE HCL ER 150 MG PO CP24: 150.0000 mg | ORAL_CAPSULE | Freq: Every day | ORAL | 0 refills | Status: AC

## 2023-07-02 MED ORDER — TERBINAFINE HCL 1 % EX CREA: TOPICAL_CREAM | Freq: Two times a day (BID) | CUTANEOUS | 0 refills | Status: AC

## 2023-07-02 MED ORDER — GUANFACINE HCL ER 2 MG PO TB24: 2.0000 mg | ORAL_TABLET | Freq: Every day | ORAL | 0 refills | Status: AC
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
## 2023-07-02 NOTE — BHH Counselor (Signed)
 Requested discharge paperwork sent to patient's CST team under [facility],  leader Sherrilyn Doll via email.  Team leader requested patient's discharge paperwork be sent at discharge to prepare the patient for appropriate follow up.   CSW team to continue to assess and engage in safe discharge planning.    Jaime Dome, MSW, LCSWA 07/02/2023 9:32 AM

## 2023-07-02 NOTE — Progress Notes (Signed)
 Patient is discharging at this time. Patient is A&Ox4. Stable. Patient denies SI,HI, and A/V/H with no plan/intent. Printed AVS reviewed with and given to patient along with medications and follow up appointments. Suicide safety plan complete with copy provided to patient. Original form in assigned binder. Patient verbalized all understanding. All valuables/belongings returned to patient. Patient is being transported by taxi. Patient denies any pain/discomfort. No s/s of current distress.

## 2023-07-02 NOTE — Progress Notes (Addendum)
  [facility] Case Management Discharge Plan :  Will you be returning to the same living situation after discharge:  Yes,  Patient to return home.  At discharge, do you have transportation home?: Yes,  CSW has arranged taxi services on patient's behalf.  Do you have the ability to pay for your medications: Yes,  [employer]AN / [employer]IL[employer]ED PLAN  Release of information consent forms completed and in the chart;  Patient's signature needed at discharge.  Patient to Follow up at:  Follow-up Information     [facility] Go to.   Why: You receive CST services through [facility]. Darra Elliot, your CST leader will visit 07/02/23 following discharge for therapy. She has referred you for medication management through your CST team and [facility].   Your medication management appointment will be 07/07/23. Your team will confirm time with you during your hospital follow up appointment on 07/02/23. Contact information: [address] [place] [address] [phone]                 Next level of care provider has access to [facility] Link:no  Safety Planning and Suicide Prevention discussed: Yes,  Education Completed; [name]/roommate [phone]), has been identified by the patient as the family member/significant other with whom the patient will be residing, and identified as the person(s) who will aid the patient in the event of a mental health crisis (suicidal ideations/suicide attempt).  With written consent from the patient, the family member/significant other has been provided the following suicide prevention education, prior to the and/or following the discharge of the patient.     Has patient been referred to the Quitline?: Patient refused referral for treatment  Patient has been referred for addiction treatment: No known substance use disorder.  Roselle Conner, LCSW 07/02/2023, 9:23 AM

## 2023-07-02 NOTE — [facility]HH Suicide Risk Assessment (Signed)
 [facility] Discharge Suicide Risk Assessment   Principal Problem: Borderline personality disorder [facility]) Discharge Diagnoses: Principal Problem:   Borderline personality disorder (HCC) Active Problems:   PTSD (post-traumatic stress disorder)   MDD (major depressive disorder)   GAD (generalized anxiety disorder)   Total Time spent with patient: 30 minutes  Musculoskeletal: Strength & Muscle Tone: within normal limits Gait & Station: normal Patient leans: N/A  Psychiatric Specialty Exam  Presentation  General Appearance:  Appropriate for Environment  Eye Contact: Good  Speech: Clear and Coherent; Normal Rate  Speech Volume: Normal  Handedness: Left   Mood and Affect  Mood: Euthymic  Duration of Depression Symptoms: Greater than two weeks  Affect: Appropriate   Thought Process  Thought Processes: Coherent; Goal Directed; Linear  Descriptions of Associations:Intact  Orientation:Full (Time, Place and Person)  Thought Content:Logical  History of Schizophrenia/Schizoaffective disorder:No  Duration of Psychotic Symptoms:No data recorded Hallucinations:Hallucinations: None  Ideas of Reference:None  Suicidal Thoughts:Suicidal Thoughts: No  Homicidal Thoughts:Homicidal Thoughts: No   Sensorium  Memory: Immediate Good; Remote Good; Recent Good  Judgment: Good  Insight: Good   Executive Functions  Concentration: Good  Attention Span: Fair  Recall: Good  Fund of Knowledge: Good  Language: Good   Psychomotor Activity  Psychomotor Activity:No data recorded  Assets  Assets: Communication Skills; Desire for Improvement; Housing; Social Support   Sleep  Sleep: Sleep: Good   Physical Exam: Physical Exam ROS Blood pressure 107/65, pulse (!) 102, temperature 97.7 F (36.5 C), resp. rate 20, height 5\' 7"  (1.702 m), weight 99.8 kg, SpO2 98%. Body mass index is 34.46 kg/m.  Mental Status Per Nursing Assessment::   On Admission:   Suicidal ideation indicated by patient  Demographic Factors:  Gay, lesbian, or bisexual orientation  Loss Factors: Loss of significant relationship  Historical Factors: Impulsivity  Risk Reduction Factors:   Living with another person, especially a relative, Positive social support, Positive therapeutic relationship, and Positive coping skills or problem solving skills  Continued Clinical Symptoms:  Cluster B , depression, and anxiety  Cognitive Features That Contribute To Risk:  None    Suicide Risk:  Minimal: No identifiable suicidal ideation.  Patients presenting with no risk factors but with morbid ruminations; may be classified as minimal risk based on the severity of the depressive symptoms   Follow-up Information     [facility] Go to.   Why: You receive CST services through [facility]. Darra Elliot, your CST leader will visit 07/02/23 following discharge for therapy. She has referred you for medication management through your CST team and [facility].   Your medication management appointment will be 07/07/23. Your team will confirm time with you during your hospital follow up appointment on 07/02/23. Contact information: [address] [place] [address] [phone]                 Plan Of Care/Follow-up recommendations:  Activity:  As tolerated  Fay Hoop, PA-C 07/02/2023, 9:07 AM

## 2023-07-06 ENCOUNTER — Emergency Department (HOSPITAL_COMMUNITY)
Admission: EM | Admit: 2023-07-06 | Discharge: 2023-07-06 | Disposition: A | Discharge status: Home / Self Care | Payer: MEDICAID | Attending: Emergency Medicine | Admitting: Emergency Medicine

## 2023-07-06 ENCOUNTER — Encounter (HOSPITAL_COMMUNITY): Payer: Self-pay

## 2023-07-06 DIAGNOSIS — R000 Tachycardia, unspecified: Secondary | ICD-10-CM | POA: Diagnosis not present

## 2023-07-06 DIAGNOSIS — R519 Headache, unspecified: Secondary | ICD-10-CM | POA: Insufficient documentation

## 2023-07-06 DIAGNOSIS — R1084 Generalized abdominal pain: Secondary | ICD-10-CM | POA: Diagnosis not present

## 2023-07-06 DIAGNOSIS — R112 Nausea with vomiting, unspecified: Secondary | ICD-10-CM | POA: Insufficient documentation

## 2023-07-06 LAB — CBC WITH DIFFERENTIAL/PLATELET
Abs Immature Granulocytes: 0.05 10*3/uL (ref 0.00–0.07)
Basophils Absolute: 0 10*3/uL (ref 0.0–0.1)
Basophils Relative: 0 %
Eosinophils Absolute: 0 10*3/uL (ref 0.0–0.5)
Eosinophils Relative: 0 %
HCT: 40.1 % (ref 39.0–52.0)
Hemoglobin: 13.5 g/dL (ref 13.0–17.0)
Immature Granulocytes: 1 %
Lymphocytes Relative: 29 %
Lymphs Abs: 2 10*3/uL (ref 0.7–4.0)
MCH: 29 pg (ref 26.0–34.0)
MCHC: 33.7 g/dL (ref 30.0–36.0)
MCV: 86.1 fL (ref 80.0–100.0)
Monocytes Absolute: 0.4 10*3/uL (ref 0.1–1.0)
Monocytes Relative: 5 %
Neutro Abs: 4.5 10*3/uL (ref 1.7–7.7)
Neutrophils Relative %: 65 %
Platelets: 265 10*3/uL (ref 150–400)
RBC: 4.66 MIL/uL (ref 4.22–5.81)
RDW: 11.8 % (ref 11.5–15.5)
WBC: 7 10*3/uL (ref 4.0–10.5)
nRBC: 0 % (ref 0.0–0.2)

## 2023-07-06 LAB — COMPREHENSIVE METABOLIC PANEL WITH GFR
ALT: 21 U/L (ref 0–44)
AST: 23 U/L (ref 15–41)
Albumin: 4 g/dL (ref 3.5–5.0)
Alkaline Phosphatase: 76 U/L (ref 38–126)
Anion gap: 9 (ref 5–15)
BUN: 14 mg/dL (ref 6–20)
CO2: 21 mmol/L — ABNORMAL LOW (ref 22–32)
Calcium: 8.9 mg/dL (ref 8.9–10.3)
Chloride: 105 mmol/L (ref 98–111)
Creatinine, Ser: 0.43 mg/dL — ABNORMAL LOW (ref 0.61–1.24)
GFR, Estimated: >60 mL/min (ref >60)
Glucose, Bld: 124 mg/dL — ABNORMAL HIGH (ref 70–99)
Potassium: 3.6 mmol/L (ref 3.5–5.1)
Sodium: 135 mmol/L (ref 135–145)
Total Bilirubin: 0.4 mg/dL (ref 0.0–1.2)
Total Protein: 7.4 g/dL (ref 6.5–8.1)

## 2023-07-06 LAB — MAGNESIUM: Magnesium: 1.9 mg/dL (ref 1.7–2.4)

## 2023-07-06 LAB — URINALYSIS, ROUTINE W REFLEX MICROSCOPIC
Bilirubin Urine: NEGATIVE
Glucose, UA: NEGATIVE mg/dL
Hgb urine dipstick: NEGATIVE
Ketones, ur: NEGATIVE mg/dL
Leukocytes,Ua: NEGATIVE
Nitrite: NEGATIVE
Protein, ur: NEGATIVE mg/dL
Specific Gravity, Urine: 1.019 (ref 1.005–1.030)
pH: 6 (ref 5.0–8.0)

## 2023-07-06 LAB — RESP PANEL BY RT-PCR (RSV, FLU A&B, COVID)  RVPGX2
Influenza A by PCR: NEGATIVE
Influenza B by PCR: NEGATIVE
Resp Syncytial Virus by PCR: NEGATIVE
SARS Coronavirus 2 by RT PCR: NEGATIVE

## 2023-07-06 LAB — LIPASE, BLOOD: Lipase: 27 U/L (ref 11–51)

## 2023-07-06 MED ORDER — PROMETHAZINE HCL 25 MG RE SUPP
25.0000 mg | Freq: Three times a day (TID) | RECTAL | 0 refills | Status: AC | PRN
Start: 2023-07-06 — End: ?

## 2023-07-06 MED ORDER — FAMOTIDINE IN NACL 20-0.9 MG/50ML-% IV SOLN
20.0000 mg | Freq: Once | INTRAVENOUS | Status: AC
  Administered 2023-07-06: 20 mg via INTRAVENOUS
  Filled 2023-07-06: qty 50

## 2023-07-06 MED ORDER — PROMETHAZINE HCL 25 MG PO TABS: 25.0000 mg | ORAL_TABLET | Freq: Three times a day (TID) | ORAL | 0 refills | Status: AC | PRN

## 2023-07-06 MED ORDER — METOCLOPRAMIDE HCL 5 MG/ML IJ SOLN
10.0000 mg | Freq: Once | INTRAMUSCULAR | Status: AC
  Administered 2023-07-06: 10 mg via INTRAVENOUS
  Filled 2023-07-06: qty 2

## 2023-07-06 MED ORDER — SODIUM CHLORIDE 0.9 % IV BOLUS
1000.0000 mL | Freq: Once | INTRAVENOUS | Status: AC
  Administered 2023-07-06: 1000 mL via INTRAVENOUS

## 2023-07-06 NOTE — ED Provider Notes (Signed)
 [place] EMERGENCY DEPARTMENT AT [facility] Provider Note   CSN: [id number] Arrival date & time: 07/06/23  1004     History  Chief Complaint  Patient presents with   Abdominal Pain   Nausea   Emesis    Scott [name] is a 23 y.o. adult (prefers "Scott [name]" and She/her pronouns) presented to ED with epigastric pain, nausea and vomiting.  Symptom onset was 3 days ago on Sunday.  The patient reports that her roommate developed similar symptoms afterwards.  The patient reports she has not been able to keep anything down he feels very dehydrated, with nausea or vomiting.  She has mostly epigastric but diffuse abdominal pain.  Denies diarrhea or constipation issues and moves her bowels regular yesterday.  Denies history of abdominal surgery.  Reports she does have a headache today.  HPI     Home Medications Prior to Admission medications   Medication Sig Start Date End Date Taking? Authorizing Provider  promethazine  (PHENERGAN ) 25 MG suppository Place 1 suppository (25 mg total) rectally every 8 (eight) hours as needed for up to 6 doses for refractory nausea / vomiting. 07/06/23  Yes Elisa Kutner, Janalyn Me, MD  promethazine  (PHENERGAN ) 25 MG tablet Take 1 tablet (25 mg total) by mouth every 8 (eight) hours as needed for up to 6 doses for nausea or vomiting. 07/06/23  Yes Joss Friedel, Janalyn Me, MD  ARIPiprazole  ER (ABILIFY  MAINTENA) 400 MG PRSY prefilled syringe Inject 400 mg into the muscle every 28 (twenty-eight) days. 05/18/23   Wray Heady, MD  estradiol  valerate (DELESTROGEN ) 20 MG/ML injection Inject 20 mg into the muscle every Sunday. 09/15/22   [provider]  guanFACINE  (INTUNIV ) 2 MG TB24 ER tablet Take 1 tablet (2 mg total) by mouth daily. 07/03/23   Millington, Takeya Marquis E, PA-C  hydrOXYzine  (ATARAX ) 25 MG tablet Take 1 tablet (25 mg total) by mouth daily as needed for anxiety. Patient not taking: Reported on 06/28/2023 03/10/23   Wray Heady, MD  montelukast   (SINGULAIR ) 10 MG tablet Take 10 mg by mouth at bedtime.    [provider]  terbinafine  (LAMISIL ) 1 % cream Apply topically 2 (two) times daily for 14 days. 07/02/23 07/16/23  Fay Hoop, PA-C  traZODone  (DESYREL ) 50 MG tablet Take 1 tablet (50 mg total) by mouth at bedtime as needed for sleep. 05/12/23 06/28/23  Wray Heady, MD  triamcinolone  ointment (KENALOG ) 0.5 % Apply 1 application. topically 2 (two) times daily. Patient taking differently: Apply 1 application  topically 2 (two) times daily as needed (For eczema). 04/29/21   Acie Acosta, [name]  venlafaxine  XR (EFFEXOR -XR) 150 MG 24 hr capsule Take 1 capsule (150 mg total) by mouth daily with breakfast. 07/03/23   Fay Hoop, PA-C      Allergies    Patient has no known allergies.    Review of Systems   Review of Systems  Physical Exam Updated Vital Signs BP 128/87   Pulse 100   Temp 98.3 F (36.8 C) (Oral)   Resp 17   Ht 5\' 7"  (1.702 m)   Wt 99.8 kg   SpO2 100%   BMI 34.46 kg/m  Physical Exam Constitutional:      General: She is not in acute distress. HENT:     Head: Normocephalic and atraumatic.  Eyes:     Conjunctiva/sclera: Conjunctivae normal.     Pupils: Pupils are equal, round, and reactive to light.  Cardiovascular:     Rate and Rhythm: Regular  rhythm. Tachycardia present.  Pulmonary:     Effort: Pulmonary effort is normal. No respiratory distress.  Abdominal:     General: There is no distension.     Tenderness: There is no abdominal tenderness. There is no guarding.  Skin:    General: Skin is warm and dry.  Neurological:     General: No focal deficit present.     Mental Status: She is alert. Mental status is at baseline.  Psychiatric:        Mood and Affect: Mood normal.        Behavior: Behavior normal.     ED Results / Procedures / Treatments   Labs (all labs ordered are listed, but only abnormal results are displayed) Labs Reviewed  COMPREHENSIVE METABOLIC PANEL  WITH GFR - Abnormal; Notable for the following components:      Result Value   CO2 21 (*)    Glucose, Bld 124 (*)    Creatinine, Ser 0.43 (*)    All other components within normal limits  RESP PANEL BY RT-PCR (RSV, FLU A&B, COVID)  RVPGX2  CBC WITH DIFFERENTIAL/PLATELET  LIPASE, BLOOD  URINALYSIS, ROUTINE W REFLEX MICROSCOPIC  MAGNESIUM     EKG EKG Interpretation Date/Time:  Tuesday Jul 06 2023 10:21:54 EDT Ventricular Rate:  99 PR Interval:  149 QRS Duration:  88 QT Interval:  358 QTC Calculation: 460 R Axis:   48  Text Interpretation: Sinus rhythm Low voltage, precordial leads Confirmed by Jerald Molly [phone]) on 07/06/2023 10:24:23 AM  Radiology No results found.  Procedures Procedures    Medications Ordered in ED Medications  sodium chloride  0.9 % bolus 1,000 mL (0 mLs Intravenous Stopped 07/06/23 1302)  famotidine (PEPCID) IVPB 20 mg premix (0 mg Intravenous Stopped 07/06/23 1145)  metoCLOPramide (REGLAN) injection 10 mg (10 mg Intravenous Given 07/06/23 1109)    ED Course/ Medical Decision Making/ A&P Clinical Course as of 07/06/23 1334  Tue Jul 06, 2023  1328 Feeling much better and tolerating PO crackers and juice [MT]    Clinical Course User Index [MT] Ronnie Doo, Janalyn Me, MD                                 Medical Decision Making Amount and/or Complexity of Data Reviewed Labs: ordered.  Risk Prescription drug management.   This patient presents to the ED with concern for nausea/vomiting. This involves an extensive number of treatment options, and is a complaint that carries with it a high risk of complications and morbidity.  The differential diagnosis includes gastroparesis versus gastritis versus pancreatitis versus biliary disease versus viral gastroenteritis versus other  I ordered and personally interpreted labs.  The pertinent results include: No emergent findings  The patient was maintained on a cardiac monitor.  I personally viewed and  interpreted the cardiac monitored which showed an underlying rhythm of: Sinus rhythm  Per my interpretation the patient's ECG shows no acute ischemic findings  I ordered medication including IV fluids, Pepcid, Reglan  I have reviewed the patients home medicines and have made adjustments as needed  Test Considered: With significant improvement of her symptoms and ability to tolerate p.o., no other significant abdominal tenderness, I have a low suspicion for acute intra-abdominal infectious or inflammatory emergencies do not feel the patient needs an emergent gallbladder or liver ultrasound, or CT imaging of the abdomen.  After the interventions noted above, I reevaluated the patient and found that they have:  improved    Disposition:  After consideration of the diagnostic results and the patients response to treatment, I feel that the patient would benefit from close outpatient follow-up.         Final Clinical Impression(s) / ED Diagnoses Final diagnoses:  Nausea and vomiting, unspecified vomiting type    Rx / DC Orders ED Discharge Orders          Ordered    promethazine  (PHENERGAN ) 25 MG tablet  Every 8 hours PRN        07/06/23 1333    promethazine  (PHENERGAN ) 25 MG suppository  Every 8 hours PRN        07/06/23 1333              Arvilla Birmingham, MD 07/06/23 1334

## 2023-07-06 NOTE — ED Triage Notes (Signed)
 Pt BIB ems for abdominal pain, nausea vomiting. Pt. States abdominal pain is getting better but the nausea and vomiting is still there. Pt. States having an aching pain in the right shoulder.

## 2023-07-06 NOTE — ED Notes (Signed)
 Pt given water and graham crackers

## 2023-07-06 NOTE — ED Notes (Signed)
 Pt reports "feeling tons better."

## 2023-07-15 ENCOUNTER — Ambulatory Visit (INDEPENDENT_AMBULATORY_CARE_PROVIDER_SITE_OTHER): Payer: MEDICAID | Admitting: Psychiatry

## 2023-07-15 ENCOUNTER — Encounter (HOSPITAL_COMMUNITY): Payer: Self-pay | Admitting: Psychiatry

## 2023-07-15 VITALS — BP 122/77 | HR 76 | Ht 67.0 in | Wt 219.0 lb

## 2023-07-15 DIAGNOSIS — F4310 Post-traumatic stress disorder, unspecified: Secondary | ICD-10-CM | POA: Diagnosis not present

## 2023-07-15 DIAGNOSIS — F603 Borderline personality disorder: Secondary | ICD-10-CM

## 2023-07-15 DIAGNOSIS — F3132 Bipolar disorder, current episode depressed, moderate: Secondary | ICD-10-CM

## 2023-07-15 DIAGNOSIS — F411 Generalized anxiety disorder: Secondary | ICD-10-CM

## 2023-07-15 DIAGNOSIS — G4700 Insomnia, unspecified: Secondary | ICD-10-CM

## 2023-07-15 DIAGNOSIS — F332 Major depressive disorder, recurrent severe without psychotic features: Secondary | ICD-10-CM

## 2023-07-15 MED ORDER — HYDROXYZINE HCL 25 MG PO TABS: 25.0000 mg | ORAL_TABLET | Freq: Every day | ORAL | 0 refills | Status: AC | PRN

## 2023-07-15 MED ORDER — TRAZODONE HCL 50 MG PO TABS: 50.0000 mg | ORAL_TABLET | Freq: Every evening | ORAL | 0 refills | Status: AC | PRN

## 2023-07-15 NOTE — Progress Notes (Signed)
 PATIENT ARRIVED FOR INJECTION -- ARIPiprazole  ER (ABILIFY  MAINTENA) 400 MG  INJECTION TOLERATED WELL IN LEFT ARM HI SI/HI NOR AH/VH  PATIENT VERY PLEASANT & STATED HE WAS TRANSFERRING TO STEP BY STEP PER HOSPITAL CASEWORKER SUGGESTION.
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
## 2023-07-15 NOTE — Patient Instructions (Signed)
 PATIENT ARRIVED FOR INJECTION -- ARIPiprazole  ER (ABILIFY  MAINTENA) 400 MG  INJECTION TOLERATED WELL IN LEFT ARM HI SI/HI NOR AH/VH  PATIENT VERY PLEASANT & STATED HE WAS TRANSFERRING TO STEP BY STEP PER HOSPITAL CASEWORKER SUGGESTION.

## 2023-07-21 ENCOUNTER — Emergency Department (HOSPITAL_COMMUNITY): Payer: MEDICAID

## 2023-07-21 ENCOUNTER — Emergency Department (HOSPITAL_COMMUNITY)
Admission: EM | Admit: 2023-07-21 | Discharge: 2023-07-21 | Disposition: A | Discharge status: Home / Self Care | Payer: MEDICAID | Attending: Emergency Medicine | Admitting: Emergency Medicine

## 2023-07-21 ENCOUNTER — Encounter (HOSPITAL_COMMUNITY): Payer: Self-pay

## 2023-07-21 ENCOUNTER — Other Ambulatory Visit: Payer: Self-pay

## 2023-07-21 DIAGNOSIS — R112 Nausea with vomiting, unspecified: Secondary | ICD-10-CM

## 2023-07-21 DIAGNOSIS — R42 Dizziness and giddiness: Secondary | ICD-10-CM | POA: Insufficient documentation

## 2023-07-21 DIAGNOSIS — R221 Localized swelling, mass and lump, neck: Secondary | ICD-10-CM

## 2023-07-21 LAB — BASIC METABOLIC PANEL WITH GFR
Anion gap: 7 (ref 5–15)
BUN: 13 mg/dL (ref 6–20)
CO2: 23 mmol/L (ref 22–32)
Calcium: 8.7 mg/dL — ABNORMAL LOW (ref 8.9–10.3)
Chloride: 104 mmol/L (ref 98–111)
Creatinine, Ser: 0.76 mg/dL (ref 0.61–1.24)
GFR, Estimated: >60 mL/min (ref >60)
Glucose, Bld: 103 mg/dL — ABNORMAL HIGH (ref 70–99)
Potassium: 3.9 mmol/L (ref 3.5–5.1)
Sodium: 134 mmol/L — ABNORMAL LOW (ref 135–145)

## 2023-07-21 LAB — CBC WITH DIFFERENTIAL/PLATELET
Abs Immature Granulocytes: 0.02 10*3/uL (ref 0.00–0.07)
Basophils Absolute: 0 10*3/uL (ref 0.0–0.1)
Basophils Relative: 0 %
Eosinophils Absolute: 0 10*3/uL (ref 0.0–0.5)
Eosinophils Relative: 0 %
HCT: 40.8 % (ref 39.0–52.0)
Hemoglobin: 13.8 g/dL (ref 13.0–17.0)
Immature Granulocytes: 0 %
Lymphocytes Relative: 24 %
Lymphs Abs: 1.5 10*3/uL (ref 0.7–4.0)
MCH: 28.8 pg (ref 26.0–34.0)
MCHC: 33.8 g/dL (ref 30.0–36.0)
MCV: 85.2 fL (ref 80.0–100.0)
Monocytes Absolute: 0.5 10*3/uL (ref 0.1–1.0)
Monocytes Relative: 7 %
Neutro Abs: 4.4 10*3/uL (ref 1.7–7.7)
Neutrophils Relative %: 69 %
Platelets: 251 10*3/uL (ref 150–400)
RBC: 4.79 MIL/uL (ref 4.22–5.81)
RDW: 12.2 % (ref 11.5–15.5)
WBC: 6.5 10*3/uL (ref 4.0–10.5)
nRBC: 0 % (ref 0.0–0.2)

## 2023-07-21 MED ORDER — DIPHENHYDRAMINE HCL 50 MG/ML IJ SOLN
25.0000 mg | Freq: Once | INTRAMUSCULAR | Status: AC
  Administered 2023-07-21: 25 mg via INTRAVENOUS
  Filled 2023-07-21: qty 1

## 2023-07-21 MED ORDER — LORAZEPAM 0.5 MG PO TABS
0.5000 mg | ORAL_TABLET | Freq: Once | ORAL | Status: AC
  Administered 2023-07-21: 0.5 mg via ORAL
  Filled 2023-07-21: qty 1

## 2023-07-21 MED ORDER — MECLIZINE HCL 25 MG PO TABS: 25.0000 mg | ORAL_TABLET | Freq: Three times a day (TID) | ORAL | 0 refills | Status: AC | PRN

## 2023-07-21 MED ORDER — LORAZEPAM 1 MG PO TABS: 1.0000 mg | ORAL_TABLET | Freq: Once | ORAL | Status: DC

## 2023-07-21 MED ORDER — ONDANSETRON HCL 4 MG/2ML IJ SOLN
4.0000 mg | Freq: Once | INTRAMUSCULAR | Status: AC
  Administered 2023-07-21: 4 mg via INTRAVENOUS
  Filled 2023-07-21: qty 2

## 2023-07-21 MED ORDER — ONDANSETRON 4 MG PO TBDP: 4.0000 mg | ORAL_TABLET | Freq: Three times a day (TID) | ORAL | 0 refills | Status: AC | PRN

## 2023-07-21 MED ORDER — LORAZEPAM 2 MG/ML IJ SOLN: 1.0000 mg | Freq: Once | INTRAMUSCULAR | Status: DC

## 2023-07-21 MED ORDER — METOCLOPRAMIDE HCL 5 MG/ML IJ SOLN
5.0000 mg | Freq: Once | INTRAMUSCULAR | Status: DC
  Filled 2023-07-21: qty 2

## 2023-07-21 MED ORDER — MECLIZINE HCL 25 MG PO TABS: 25.0000 mg | ORAL_TABLET | Freq: Three times a day (TID) | ORAL | 0 refills | Status: DC | PRN

## 2023-07-21 MED ORDER — SODIUM CHLORIDE 0.9 % IV BOLUS
1000.0000 mL | Freq: Once | INTRAVENOUS | Status: AC
  Administered 2023-07-21: 1000 mL via INTRAVENOUS

## 2023-07-21 MED ORDER — GADOBUTROL 1 MMOL/ML IV SOLN
10.0000 mL | Freq: Once | INTRAVENOUS | Status: AC | PRN
  Administered 2023-07-21: 10 mL via INTRAVENOUS

## 2023-07-21 MED ORDER — MECLIZINE HCL 25 MG PO TABS
25.0000 mg | ORAL_TABLET | Freq: Once | ORAL | Status: AC
  Administered 2023-07-21: 25 mg via ORAL
  Filled 2023-07-21: qty 1

## 2023-07-21 MED ORDER — METOCLOPRAMIDE HCL 5 MG/ML IJ SOLN
5.0000 mg | Freq: Once | INTRAMUSCULAR | Status: AC
  Administered 2023-07-21: 5 mg via INTRAVENOUS

## 2023-07-21 NOTE — ED Notes (Signed)
 Called lab. They said the BMP is still running.

## 2023-07-21 NOTE — ED Triage Notes (Signed)
 Pt states she has had "extreme dizziness" since last night that is causing her to vomit. Denies any other s/s. Pt unsteady on feet when moving from wheelchair to bed.  States every time you she moves her head it gets worse.

## 2023-07-21 NOTE — Discharge Instructions (Addendum)
 Continue meclizine up to 3 times daily as needed for vertigo symptoms.  Continue Zofran  up to 3 times daily as needed for nausea.  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  Contact the ENT provider listed above if your vertigo symptoms persist.  Please follow-up with your primary care provider in the next 48 to 72 hours.  Seek emergency care if experiencing any new or worsening symptoms.

## 2023-07-21 NOTE — ED Provider Notes (Signed)
 Patient handed-off from previous PA-C, [employer].   History of vertigo associated with allergies, worse today. Neuro exam benign, no nystagmus/cerebellar dysfunction/vision changes/headache/neck pain. Symptomatic improvement with Meclizine, then started vomiting and given Zofran . MRI brain ordered.   Physical Exam  BP 132/81   Pulse 93   Temp 98.2 F (36.8 C) (Oral)   Resp 19   Ht 5\' 7"  (1.702 m)   Wt 99 kg   SpO2 98%   BMI 34.18 kg/m   Physical Exam  Procedures  Procedures  ED Course / MDM    Medical Decision Making MRI pending at time of patient hand-off. Will PO challenge and reassess prior to discharge.  4:00pm - I was contacted via secure chat by the MRI tech who requested MR Soft tissue neck w/wo at the request of the neuroradiologist, she has a mass near base of skull/upper neck and this additional imaging was requested to further evaluate this area.   MRI brain results:  1. No evidence of an acute intracranial abnormality. 2. Mild cerebellar tonsillar ectopia. 3. Otherwise unremarkable non-contrast MRI appearance of the brain. 4. Incompletely assessed 2.6 x 2.4 cm left parapharyngeal space mass. Differential considerations include nerve sheath tumor (such as schwannoma), salivary gland tumor and paraganglioma, among others. Correlate with findings on the pending neck MRI which has been ordered through the emergency department. 5. Minor left maxillary sinus mucosal thickening. 6. Trace right mastoid effusion  MRI neck/soft tissues:  2.6 cm well-circumscribed enhancing mass in the left parapharyngeal space abutting the left carotid space. 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 Given somewhat heterogeneous appearance on post-contrast and T2 images there are possible flow voids within the lesion as can be seen with paraganglioma. Additional considerations including salivary gland tumor or enlarged retropharyngeal lymph node are less likely.   No abnormally  enlarged lymph nodes in the neck.   Mass abuts the mid/distal left cervical ICA with slight displacement of the vessel and possible mild narrowing without interruption of the flow void.  Consults: I spoke with neurosurgery who advised that I reach out to ENT for further follow-up regarding the incidental finding of a left-sided parapharyngeal mass abutting the carotid.  I spoke with Dr. 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  I spoke with Dr. Fulton Job with vascular surgery who advised that they can follow-up with this patient in the outpatient setting and that this is likely an incidental finding and not contributing to the patient's vertigo, they recommended that this patient also be followed by ENT for this issue. Dr. Fulton Job made note of this patient's MRN and will contact their office in regard to setting up an appointment.  I discussed MRI findings with this patient, I emphasized the importance of them seeking follow-up with vascular surgery/ENT in regard to this issue.  They voiced understanding.  I sent meclizine and Zofran  to the 24-hour [facility], recommend follow-up with their primary care provider this week if symptoms persist.  Advised patient to return to the emergency department if their symptoms worsen.     Amount and/or Complexity of Data Reviewed Labs: ordered. Radiology: ordered.  Risk Prescription drug management.          Kendrick Pax, [place] 07/21/23 1910    Burnette Carte, MD 07/21/23 [phone]

## 2023-07-21 NOTE — ED Provider Notes (Signed)
 [place] EMERGENCY DEPARTMENT AT [facility] Provider Note   CSN: [id number] Arrival date & time: 07/21/23  8416     History  Chief Complaint  Patient presents with   Dizziness    ELZIA HOTT is a 23 y.o. adult (prefers "[name]" and She/her pronouns) with PMHx [facility], allergies, anxiety, depression who presents to ED concerned for vertigo x2 hours. Patient stating that they have experienced more mild vertigo in the past, but symptoms today are more severe. Symptoms started when patient woke up this morning and states that the room was spinning. Patient then had 3 episodes of vomiting. The vertigo is better when laying still and exacerbated by movement.   Patient denies recent sick contact or suspicious food intake. Denies fever, chest pain, cough, rhinorrhea, congestion, ear pain, tinnitus. Patient denies headache, neck pain, diplopia/vision changes.    Dizziness      Home Medications Prior to Admission medications   Medication Sig Start Date End Date Taking? Authorizing Provider  meclizine (ANTIVERT) 25 MG tablet Take 1 tablet (25 mg total) by mouth 3 (three) times daily as needed for up to 10 days for dizziness. 07/21/23 07/31/23 Yes Natina Wiginton, Twila Gale F, PA-C  ARIPiprazole  ER (ABILIFY  MAINTENA) 400 MG PRSY prefilled syringe Inject 400 mg into the muscle every 28 (twenty-eight) days. 05/18/23   Wray Heady, MD  estradiol  valerate (DELESTROGEN ) 20 MG/ML injection Inject 20 mg into the muscle every Sunday. 09/15/22   [provider]  guanFACINE  (INTUNIV ) 2 MG TB24 ER tablet Take 1 tablet (2 mg total) by mouth daily. 07/03/23   Millington, Matthew E, PA-C  hydrOXYzine  (ATARAX ) 25 MG tablet Take 1 tablet (25 mg total) by mouth daily as needed for anxiety. 07/15/23   Wray Heady, MD  montelukast  (SINGULAIR ) 10 MG tablet Take 10 mg by mouth at bedtime.    [provider]  promethazine  (PHENERGAN ) 25 MG suppository Place 1 suppository (25 mg total) rectally  every 8 (eight) hours as needed for up to 6 doses for refractory nausea / vomiting. 07/06/23   Arvilla Birmingham, MD  promethazine  (PHENERGAN ) 25 MG tablet Take 1 tablet (25 mg total) by mouth every 8 (eight) hours as needed for up to 6 doses for nausea or vomiting. 07/06/23   Arvilla Birmingham, MD  traZODone  (DESYREL ) 50 MG tablet Take 1 tablet (50 mg total) by mouth at bedtime as needed for sleep. 07/15/23 08/14/23  Wray Heady, MD  triamcinolone  ointment (KENALOG ) 0.5 % Apply 1 application. topically 2 (two) times daily. Patient taking differently: Apply 1 application  topically 2 (two) times daily as needed (For eczema). 04/29/21   Acie Acosta, [name]  venlafaxine  XR (EFFEXOR -XR) 150 MG 24 hr capsule Take 1 capsule (150 mg total) by mouth daily with breakfast. 07/03/23   Fay Hoop, PA-C      Allergies    Patient has no known allergies.    Review of Systems   Review of Systems  Neurological:  Positive for dizziness.    Physical Exam Updated Vital Signs BP 132/81   Pulse 93   Temp 98.2 F (36.8 C) (Oral)   Resp 19   Ht 5\' 7"  (1.702 m)   Wt 99 kg   SpO2 98%   BMI 34.18 kg/m  Physical Exam Vitals and nursing note reviewed.  Constitutional:      General: She is not in acute distress.    Appearance: She is not ill-appearing or toxic-appearing.  HENT:  Head: Normocephalic and atraumatic.     Right Ear: Tympanic membrane and ear canal normal.     Left Ear: Tympanic membrane and ear canal normal.     Mouth/Throat:     Mouth: Mucous membranes are moist.  Eyes:     General: No scleral icterus.       Right eye: No discharge.        Left eye: No discharge.     Conjunctiva/sclera: Conjunctivae normal.  Cardiovascular:     Rate and Rhythm: Normal rate and regular rhythm.     Pulses: Normal pulses.     Heart sounds: Normal heart sounds. No murmur heard. Pulmonary:     Effort: Pulmonary effort is normal. No respiratory distress.     Breath sounds: Normal breath  sounds. No wheezing, rhonchi or rales.  Abdominal:     General: Abdomen is flat. Bowel sounds are normal. There is no distension.     Palpations: Abdomen is soft. There is no mass.     Tenderness: There is no abdominal tenderness.  Musculoskeletal:     Right lower leg: No edema.     Left lower leg: No edema.  Skin:    General: Skin is warm and dry.     Findings: No rash.  Neurological:     General: No focal deficit present.     Mental Status: She is alert and oriented to person, place, and time. Mental status is at baseline.     Comments: GCS 15. Speech is goal oriented. No deficits appreciated to CN III-XII; symmetric eyebrow raise, no facial drooping, tongue midline. Patient has equal grip strength bilaterally with 5/5 strength against resistance in all major muscle groups bilaterally. Sensation to light touch intact. Patient moves extremities without ataxia. Normal finger-nose-finger. No nystagmus.    Psychiatric:        Mood and Affect: Mood normal.     ED Results / Procedures / Treatments   Labs (all labs ordered are listed, but only abnormal results are displayed) Labs Reviewed  BASIC METABOLIC PANEL WITH GFR - Abnormal; Notable for the following components:      Result Value   Sodium 134 (*)    Glucose, Bld 103 (*)    Calcium 8.7 (*)    All other components within normal limits  CBC WITH DIFFERENTIAL/PLATELET    EKG EKG Interpretation Date/Time:  Wednesday July 21 2023 07:23:51 EDT Ventricular Rate:  82 PR Interval:  176 QRS Duration:  78 QT Interval:  373 QTC Calculation: 436 R Axis:   55  Text Interpretation: Sinus rhythm Anteroseptal infarct, age indeterminate similar to prior no stemi Confirmed by Russella Courts ([id number]) on 07/21/2023 7:33:41 AM  Radiology No results found.  Procedures Procedures    Medications Ordered in ED Medications  ondansetron  (ZOFRAN ) injection 4 mg (4 mg Intravenous Given 07/21/23 0748)  sodium chloride  0.9 % bolus 1,000 mL (1,000  mLs Intravenous New Bag/Given 07/21/23 7829)  meclizine (ANTIVERT) tablet 25 mg (25 mg Oral Given 07/21/23 0821)  diphenhydrAMINE  (BENADRYL ) injection 25 mg (25 mg Intravenous Given 07/21/23 1059)  LORazepam  (ATIVAN ) tablet 0.5 mg (0.5 mg Oral Given 07/21/23 1201)  metoCLOPramide  (REGLAN ) injection 5 mg (5 mg Intravenous Given 07/21/23 1110)    ED Course/ Medical Decision Making/ A&P                                 Medical Decision Making Amount and/or Complexity  of Data Reviewed Labs: ordered. Radiology: ordered.  Risk Prescription drug management.   his patient presents to the ED for concern of vertigo, this involves an extensive number of treatment options, and is a complaint that carries with it a high risk of complications and morbidity.  The differential diagnosis includes CVA, ICH, intracranial mass, critical dehydration, heptatic dysfunction, uremia, hypercarbia, intoxication/withdrawal, endocrine abnormality, sepsis/infection, vestibular neuritis, peripheral vertigo.   Co morbidities that complicate the patient evaluation  AHDH, allergies, anxiety, depression    Additional history obtained:  Dr. Emilie Harden PCP   Problem List / ED Course / Critical interventions / Medication management  Patient presented for vertigo. Has hx of more mild cases of vertigo. Also with vomiting that started with the vertigo this morning.  I Ordered, and personally interpreted labs.  CBC without leukocytosis or anemia.  BMP with mild hyponatremia at 134. The patient was maintained on a cardiac monitor.  I personally viewed and interpreted the cardiac monitored which showed an underlying rhythm of: sinus rhythm. Patient without headache, neck pain, diplopia, dysarthria, dysmetria, dysphonia, dysphagia, vertical nystagmus. Neuro exam is unremarkable. These findings are reassuring that patient is not suffering from an acute stroke at this time. Dizziness initially resolved with PO Meclizine and patient  ambulated well to bathroom. The vertigo then started again and patient stating that they started vomiting again with head movement. Educated patient that we could proceed with MRI to ensure that no other emergent process was happening at this time. Patient initially refusing, but then agreed to obtain MRI.  imaging is pending at this time. I have reviewed the patients home medicines and have made adjustments as needed  Social Determinants of Health:  none  3PM Care of MASSEY RUHLAND  transferred to PA Erin at the end of my shift as the patient will require reassessment once labs/imaging have resulted. Patient presentation, ED course, and plan of care discussed with review of all pertinent labs and imaging. Please see his/her note for further details regarding further ED course and disposition. Plan at time of handoff is reassess patient after MRI and PO challenge. Patient may have another dose of Meclizine or Ativan  while in ED if necessary. This may be altered or completely changed at the discretion of the oncoming team pending results of further workup.         Final Clinical Impression(s) / ED Diagnoses Final diagnoses:  Vertigo    Rx / DC Orders ED Discharge Orders          Ordered    meclizine (ANTIVERT) 25 MG tablet  3 times daily PRN        07/21/23 1022               Bureau, PA-C 07/21/23 1504    Russella Courts A, DO 07/22/23 [phone]

## 2023-07-24 ENCOUNTER — Emergency Department (HOSPITAL_COMMUNITY)
Admission: EM | Admit: 2023-07-24 | Discharge: 2023-07-24 | Disposition: A | Discharge status: Home / Self Care | Payer: MEDICAID | Attending: Emergency Medicine | Admitting: Emergency Medicine

## 2023-07-24 ENCOUNTER — Other Ambulatory Visit: Payer: Self-pay

## 2023-07-24 DIAGNOSIS — R42 Dizziness and giddiness: Secondary | ICD-10-CM | POA: Diagnosis present

## 2023-07-24 MED ORDER — DIAZEPAM 5 MG PO TABS
5.0000 mg | ORAL_TABLET | Freq: Once | ORAL | Status: AC
  Administered 2023-07-24: 5 mg via ORAL
  Filled 2023-07-24: qty 1

## 2023-07-24 MED ORDER — MECLIZINE HCL 25 MG PO TABS
25.0000 mg | ORAL_TABLET | Freq: Once | ORAL | Status: AC
  Administered 2023-07-24: 25 mg via ORAL
  Filled 2023-07-24: qty 1

## 2023-07-24 MED ORDER — DIAZEPAM 5 MG PO TABS: 5.0000 mg | ORAL_TABLET | Freq: Two times a day (BID) | ORAL | 0 refills | Status: DC

## 2023-07-24 MED ORDER — DIAZEPAM 2 MG PO TABS: 2.0000 mg | ORAL_TABLET | Freq: Three times a day (TID) | ORAL | 0 refills | Status: AC | PRN

## 2023-07-24 NOTE — ED Triage Notes (Signed)
 PT BIB EMS coming from home c/o of dizziness. Seen recently for vertigo but has been getting worse. States meds are not helping. States having nausea.   EMS BP 130/90, HR 90, RR 14, Spo2 96%, CBG 97

## 2023-07-24 NOTE — ED Provider Notes (Signed)
 [place] EMERGENCY DEPARTMENT AT [facility] Provider Note   CSN: [id number] Arrival date & time: 07/24/23  0865     History  No chief complaint on file.   Scott [name] is a 23 y.o. adult.  Patient presenting with continued vertigo.  Patient was seen in this emergency department several days ago for same complaint, was discharged with prescription for meclizine /Zofran  and follow-up appointment with vascular surgery and PCP Tuesday.  Patient reports vertigo symptoms have been getting worse, they continue to complain of nausea with a sensation of "the room is spinning", this occurs at rest and with positional changes.  Patient denies loss of consciousness, headache, visual changes.        Home Medications Prior to Admission medications   Medication Sig Start Date End Date Taking? Authorizing Provider  ARIPiprazole  ER (ABILIFY  MAINTENA) 400 MG PRSY prefilled syringe Inject 400 mg into the muscle every 28 (twenty-eight) days. 05/18/23   Wray Heady, MD  estradiol  valerate (DELESTROGEN ) 20 MG/ML injection Inject 20 mg into the muscle every Sunday. 09/15/22   [provider]  guanFACINE  (INTUNIV ) 2 MG TB24 ER tablet Take 1 tablet (2 mg total) by mouth daily. 07/03/23   Millington, Matthew E, PA-C  hydrOXYzine  (ATARAX ) 25 MG tablet Take 1 tablet (25 mg total) by mouth daily as needed for anxiety. 07/15/23   Wray Heady, MD  meclizine  (ANTIVERT ) 25 MG tablet Take 1 tablet (25 mg total) by mouth 3 (three) times daily as needed for up to 10 days for dizziness. 07/21/23 07/31/23  Kendrick Pax, PA-C  montelukast  (SINGULAIR ) 10 MG tablet Take 10 mg by mouth at bedtime.    [provider]  ondansetron  (ZOFRAN -ODT) 4 MG disintegrating tablet Take 1 tablet (4 mg total) by mouth every 8 (eight) hours as needed for nausea or vomiting. 07/21/23   Ebony Yorio, Angelita Kendall, PA-C  promethazine  (PHENERGAN ) 25 MG suppository Place 1 suppository (25 mg total) rectally every 8 (eight) hours  as needed for up to 6 doses for refractory nausea / vomiting. 07/06/23   Arvilla Birmingham, MD  promethazine  (PHENERGAN ) 25 MG tablet Take 1 tablet (25 mg total) by mouth every 8 (eight) hours as needed for up to 6 doses for nausea or vomiting. 07/06/23   Arvilla Birmingham, MD  traZODone  (DESYREL ) 50 MG tablet Take 1 tablet (50 mg total) by mouth at bedtime as needed for sleep. 07/15/23 08/14/23  Wray Heady, MD  triamcinolone  ointment (KENALOG ) 0.5 % Apply 1 application. topically 2 (two) times daily. Patient taking differently: Apply 1 application  topically 2 (two) times daily as needed (For eczema). 04/29/21   Acie Acosta, [name]  venlafaxine  XR (EFFEXOR -XR) 150 MG 24 hr capsule Take 1 capsule (150 mg total) by mouth daily with breakfast. 07/03/23   Fay Hoop, PA-C      Allergies    Patient has no known allergies.    Review of Systems   Review of Systems  Physical Exam Updated Vital Signs BP 135/84 (BP Location: Left Arm)   Pulse 87   Temp 98 F (36.7 C) (Oral)   Resp 18   Ht 5\' 7"  (1.702 m)   Wt 99.8 kg   SpO2 100%   BMI 34.46 kg/m  Physical Exam Vitals and nursing note reviewed.  HENT:     Head: Normocephalic.  Eyes:     Extraocular Movements: Extraocular movements intact.     Right eye: No nystagmus.     Left eye:  No nystagmus.     Pupils: Pupils are equal, round, and reactive to light.  Cardiovascular:     Rate and Rhythm: Normal rate and regular rhythm.  Pulmonary:     Effort: Pulmonary effort is normal.     Breath sounds: Normal breath sounds.  Abdominal:     Palpations: Abdomen is soft.     Tenderness: There is no abdominal tenderness. There is no guarding.  Musculoskeletal:        General: Normal range of motion.     Cervical back: Normal range of motion. No rigidity.     Comments: 5 out of 5 strength against resistance of bilateral upper and lower extremities  Skin:    General: Skin is warm and dry.  Neurological:     General: No focal  deficit present.     Mental Status: She is alert and oriented to person, place, and time.     Sensory: No sensory deficit.     Motor: No weakness.     Comments: Cerebellar testing including rapid alternating movements, finger-nose, heel down shin all within normal limits     ED Results / Procedures / Treatments   Labs (all labs ordered are listed, but only abnormal results are displayed) Labs Reviewed - No data to display  EKG None  Radiology No results found.  Procedures Procedures    Medications Ordered in ED Medications  diazepam (VALIUM) tablet 5 mg (5 mg Oral Given 07/24/23 1046)  meclizine  (ANTIVERT ) tablet 25 mg (25 mg Oral Given 07/24/23 1046)    ED Course/ Medical Decision Making/ A&P                                 Medical Decision Making This patient presents to the ED for concern of vertigo, this involves an extensive number of treatment options, and is a complaint that carries with it a high risk of complications and morbidity.  The differential diagnosis includes BPPV, labyrinthitis, Meniere's, intracranial mass.   Additional history obtained:  Additional history obtained from record review External records from outside source obtained and reviewed including recent ED note     Problem List / ED Course / Critical interventions / Medication management   I ordered medication including valium and meclizine   for vertigo symptoms  Reevaluation of the patient after these medicines showed that the patient improved I have reviewed the patients home medicines and have made adjustments as needed   Social Determinants of Health:  Former tobacco use   Test / Admission - Considered:  Patient had full workup for same issue several days ago, see previous note for further details.  Physical exam is largely reassuring, see above for details. Valium and meclizine  given in the emergency department today, upon my reassessment the patient is sleeping comfortably.   Patient has appointment with her primary care provider on Tuesday as well as appointment with vascular surgeon given findings noted during recent emergency department stay.  Given reassuring workup several days ago, I do not feel that additional lab/imaging is warranted at this time, given patient's symptoms persist yet she does have appointment with vascular surgery/PCP, advised patient to keep these appointments as scheduled.  Will prescribe 2 mg Valium to be used every 8 hours as needed for breakthrough vertigo symptoms, continue meclizine /Zofran  as previously directed.  Patient voiced understanding and is in agreement with this plan, return precautions discussed.  Staffed with Dr. Leighton Punches  Risk Prescription drug management.           Final Clinical Impression(s) / ED Diagnoses Final diagnoses:  Vertigo    Rx / DC Orders ED Discharge Orders          Ordered    diazepam (VALIUM) 5 MG tablet  2 times daily,   Status:  Discontinued        07/24/23 1315    diazepam (VALIUM) 2 MG tablet  Every 8 hours PRN        07/24/23 1316              Kendrick Pax, [place] 07/24/23 1320    Lind Repine, MD 07/24/23 1457

## 2023-07-24 NOTE — Discharge Instructions (Addendum)
 Keep your scheduled follow-up with vascular surgery and your primary care provider as previously directed.  Discuss occupational therapy referral with your primary care provider, they may be able to provide you with maneuvers to relieve your dizziness.  Continue meclizine  as needed for dizziness and Zofran  as needed for nausea, you can use Valium every 8 hours as needed for vertigo symptoms.

## 2023-07-24 NOTE — ED Notes (Signed)
 [name] gave verbal consent for MSE signature, no pad to sign

## 2023-07-24 NOTE — ED Provider Notes (Signed)
 I provided a substantive portion of the care of this patient.  I personally made/approved the management plan for this patient and take responsibility for the patient management.      23 year old who presents with vertigo.  Recently had extensive workup for this.  Medicated here.  No emesis taking fluids well.  Has been referred back to their PCP for OT therapy for vestibular exercises   Lind Repine, MD 07/24/23 1308

## 2023-07-27 ENCOUNTER — Encounter: Payer: Self-pay | Admitting: Vascular Surgery

## 2023-07-27 ENCOUNTER — Ambulatory Visit: Payer: MEDICAID | Attending: Vascular Surgery | Admitting: Vascular Surgery

## 2023-07-27 VITALS — BP 117/85 | HR 90 | Temp 98.3°F | Ht 67.0 in | Wt 216.0 lb

## 2023-07-27 DIAGNOSIS — R221 Localized swelling, mass and lump, neck: Secondary | ICD-10-CM | POA: Insufficient documentation

## 2023-07-27 NOTE — Progress Notes (Unsigned)
 [facility] [place]  ASSESSMENT / PLAN: 23 y.o. 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  Unclear biology of the mass based on MRI (full report copied below).  I counseled Scott [name] extensively about the findings.  I think that she would be best served by referral to a university system given the anatomic location of the mass.  I do not feel like I can safely remove the mass based on its anatomic location.  Will refer to [facility] neurosurgery for further evaluation.  CHIEF COMPLAINT: Vertigo  HISTORY OF PRESENT ILLNESS: Scott [name] is a 23 y.o. 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  The patient has serially presented to the emergency department for evaluation of vertigo and nausea/vomiting.  Ultimately a MRI of the brain and neck were performed identifying the mass.  Patient was referred to vascular clinic because of, I assume, the close relationship of the mass to the internal carotid artery.  On my evaluation, the mass is essentially at the base of the skull.  I shared the images with Loyde Rule extensively and reviewed the anatomic challenges related to excision of the tumor.  I counseled Scott [name] that we do not have a definitive diagnosis of the tumor biology.  I counseled Scott [name] that care with a University system would be her best chance at a positive outcome.  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   History of admission to inpatient psychiatry department 10/06/2022   History of suicide attempt 01/27/2021   Suicide attempt (HCC) 01/27/2021   2022 - overdose on trazodone  - wanted to die   12/22 - overdose on estradiol        Past Surgical History:  Procedure Laterality Date   ADENOIDECTOMY     EYE MUSCLE SURGERY Bilateral    x3   MYRINGOTOMY WITH TUBE PLACEMENT Bilateral    TONSILLECTOMY AND ADENOIDECTOMY Bilateral 02/03/2013   Procedure: TONSILLECTOMY AND ADENOIDECTOMY;  Surgeon: Ammon Bales, MD;  Location: [facility];  Service: ENT;  Laterality: Bilateral;    Family History  Problem Relation Age of Onset   Mental illness Mother    Bipolar disorder Mother    Personality disorder Mother    Alcohol abuse Mother    Drug abuse Mother    Skin cancer Maternal Grandmother    Mental illness Paternal Grandmother    Hepatitis C Paternal Grandfather    Cirrhosis Paternal Grandfather    Heart attack Paternal Grandfather     Social History   Socioeconomic History   Marital status: Single    Spouse name: Not on file   Number of children: Not on file   Years of education: Not on file   Highest education level: High school graduate  Occupational History   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    Comment: works at [employer]  Tobacco Use   Smoking status: Former    Types: Cigarettes    Passive exposure: Yes   Smokeless tobacco: Never   Tobacco comments:    Dad vapes in house and car - maybe with nicotine    No nicotine use since 23yo  Vaping Use  Vaping status: Every Day   Substances: CBD  Substance and Sexual Activity   Alcohol use: Not Currently    Alcohol/week: 1.0 standard drink of alcohol    Types: 1 Shots of liquor per week    Comment: every few months, socially   Drug use: Not Currently    Types: Marijuana    Comment: last used 08/2022   Sexual activity: Not on file  Other Topics Concern   Not on file  Social History Narrative   Lives with dad, and sister.    He has graduated from high school   Recently quit working at [employer]   Social Drivers of [employer]: Low Risk  (04/14/2023)   Received from [facility]    Overall Financial Resource Strain (CARDIA)    Difficulty of Paying Living Expenses: Not hard at all  Food Insecurity: No Food Insecurity (06/28/2023)   Hunger Vital Sign    Worried About Running Out of Food in the Last Year: Never true    Ran Out of Food in the Last Year: Never true  Transportation Needs: No Transportation Needs (06/28/2023)   PRAPARE - Administrator, Civil Service (Medical): No    Lack of Transportation (Non-Medical): No  Physical Activity: Unknown (03/06/2022)   Received from [facility], [facility]   Exercise Vital Sign    Days of Exercise per Week: 0 days    Minutes of Exercise per Session: Not on file  Stress: Patient Declined (07/04/2022)   Received from [facility], [facility] of Occupational Health - Occupational Stress Questionnaire    Feeling of Stress : Patient declined  Social Connections: Socially Isolated (03/06/2022)   Received from [facility], [facility]   Social Network    How would you rate your social network (family, work, friends)?: Little participation, lonely and socially isolated  Intimate Partner Violence: Not At Risk (06/28/2023)   Humiliation, Afraid, Rape, and Kick questionnaire    Fear of Current or Ex-Partner: No    Emotionally Abused: No    Physically Abused: No    Sexually Abused: No    No Known Allergies  Current Outpatient Medications  Medication Sig Dispense Refill   ARIPiprazole  ER (ABILIFY  MAINTENA) 400 MG PRSY prefilled syringe Inject 400 mg into the muscle every 28 (twenty-eight) days. 1 each 12   diazepam (VALIUM) 2 MG tablet Take 1 tablet (2 mg total) by mouth every 8 (eight) hours as needed for up to 5 days for anxiety. As needed for vertigo symptoms 10 tablet 0   estradiol  valerate (DELESTROGEN ) 20 MG/ML injection Inject 20 mg into the muscle every Sunday.     guanFACINE  (INTUNIV ) 2 MG TB24 ER tablet Take 1 tablet (2 mg total) by mouth daily. 30 tablet 0   hydrOXYzine   (ATARAX ) 25 MG tablet Take 1 tablet (25 mg total) by mouth daily as needed for anxiety. 30 tablet 0   meclizine  (ANTIVERT ) 25 MG tablet Take 1 tablet (25 mg total) by mouth 3 (three) times daily as needed for up to 10 days for dizziness. 30 tablet 0   montelukast  (SINGULAIR ) 10 MG tablet Take 10 mg by mouth at bedtime.     ondansetron  (ZOFRAN -ODT) 4 MG disintegrating tablet Take 1 tablet (4 mg total) by mouth every 8 (eight) hours as needed for nausea or vomiting. 20 tablet 0   promethazine  (PHENERGAN ) 25 MG suppository Place 1 suppository (25 mg total) rectally every 8 (  eight) hours as needed for up to 6 doses for refractory nausea / vomiting. 6 each 0   promethazine  (PHENERGAN ) 25 MG tablet Take 1 tablet (25 mg total) by mouth every 8 (eight) hours as needed for up to 6 doses for nausea or vomiting. 6 tablet 0   traZODone  (DESYREL ) 50 MG tablet Take 1 tablet (50 mg total) by mouth at bedtime as needed for sleep. 30 tablet 0   triamcinolone  ointment (KENALOG ) 0.5 % Apply 1 application. topically 2 (two) times daily. (Patient taking differently: Apply 1 application  topically 2 (two) times daily as needed (For eczema).) 60 g 3   venlafaxine  XR (EFFEXOR -XR) 150 MG 24 hr capsule Take 1 capsule (150 mg total) by mouth daily with breakfast. 30 capsule 0   No current facility-administered medications for this visit.    PHYSICAL EXAM Vitals:   07/27/23 1258  BP: 117/85  Pulse: 90  Temp: 98.3 F (36.8 C)  SpO2: 95%  Weight: 216 lb (98 kg)  Height: 5\' 7"  (1.702 m)    Constitutional: *** appearing. *** distress. Appears *** nourished.  Neurologic: CN ***. *** focal findings. *** sensory loss. Psychiatric: *** Mood and affect symmetric and appropriate. Eyes: *** No icterus. No conjunctival pallor. Ears, nose, throat: *** mucous membranes moist. Midline trachea.  Cardiac: *** rate and rhythm.  Respiratory: *** unlabored. Abdominal: *** soft, non-tender, non-distended.  Peripheral vascular:  *** Extremity: *** edema. *** cyanosis. *** pallor.  Skin: *** gangrene. *** ulceration.  Lymphatic: *** Stemmer's sign. *** palpable lymphadenopathy.    PERTINENT LABORATORY AND RADIOLOGIC DATA  Most recent CBC    Latest Ref Rng & Units 07/21/2023    7:50 AM 07/06/2023   11:04 AM 06/27/2023    9:00 PM  CBC  WBC 4.0 - 10.5 K/uL 6.5  7.0  8.3   Hemoglobin 13.0 - 17.0 g/dL 60.4  54.0  98.1   Hematocrit 39.0 - 52.0 % 40.8  40.1  36.9   Platelets 150 - 400 K/uL 251  265  265      Most recent CMP    Latest Ref Rng & Units 07/21/2023    7:50 AM 07/06/2023   11:04 AM 06/27/2023    9:00 PM  CMP  Glucose 70 - 99 mg/dL 191  478  80   BUN 6 - 20 mg/dL 13  14  10    Creatinine 0.61 - 1.24 mg/dL 2.95  6.21  3.08   Sodium 135 - 145 mmol/L 134  135  139   Potassium 3.5 - 5.1 mmol/L 3.9  3.6  4.0   Chloride 98 - 111 mmol/L 104  105  106   CO2 22 - 32 mmol/L 23  21  24    Calcium 8.9 - 10.3 mg/dL 8.7  8.9  8.9   Total Protein 6.5 - 8.1 g/dL  7.4  6.3   Total Bilirubin 0.0 - 1.2 mg/dL  0.4  0.3   Alkaline Phos 38 - 126 U/L  76  70   AST 15 - 41 U/L  23  30   ALT 0 - 44 U/L  21  20     Renal function Estimated Creatinine Clearance (by C-G formula based on SCr of 0.76 mg/dL) Male: 657.8 mL/min Male: 163 mL/min  Hgb A1c MFr Bld (%)  Date Value  06/27/2023 5.1    LDL Cholesterol  Date Value Ref Range Status  06/27/2023 136 (H) 0 - 99 mg/dL Final    Comment:  Total Cholesterol/HDL:CHD Risk Coronary Heart Disease Risk Table                     Men   Women  1/2 Average Risk   3.4   3.3  Average Risk       5.0   4.4  2 X Average Risk   9.6   7.1  3 X Average Risk  23.4   11.0        Use the calculated Patient Ratio above and the CHD Risk Table to determine the patient's CHD Risk.        ATP III CLASSIFICATION (LDL):  <100     mg/dL   Optimal  956-213  mg/dL   Near or Above                    Optimal  130-159  mg/dL   Borderline  086-578  mg/dL   High  >469      mg/dL   Very High Performed at [facility], [address], [place], [place] [address]      Vascular Imaging: ***  Heber Little. Edgardo Goodwill, MD FACS [facility] Phone Number: [phone] 07/27/2023 8:53 PM   Total time spent on preparing this encounter including chart review, data review, collecting history, examining the patient, and coordinating care: {tnhtimebilling:26202} {billinglist:27273}  Portions of this report may have been transcribed using voice recognition software.  Every effort has been made to ensure accuracy; however, inadvertent computerized transcription errors may still be present.

## 2023-07-29 ENCOUNTER — Telehealth: Payer: Self-pay

## 2023-07-29 NOTE — Telephone Encounter (Signed)
 Referral faxed to [facility] per Dr. Edgardo Goodwill request.  Phone number [phone]

## 2023-09-03 ENCOUNTER — Ambulatory Visit: Payer: MEDICAID | Attending: Physician Assistant

## 2023-09-20 ENCOUNTER — Ambulatory Visit: Payer: MEDICAID | Attending: Physician Assistant

## 2023-09-20 DIAGNOSIS — R2681 Unsteadiness on feet: Secondary | ICD-10-CM | POA: Insufficient documentation

## 2023-09-20 DIAGNOSIS — R42 Dizziness and giddiness: Secondary | ICD-10-CM | POA: Insufficient documentation

## 2023-09-20 NOTE — Therapy (Signed)
 OUTPATIENT PHYSICAL THERAPY VESTIBULAR EVALUATION     Patient Name: Scott [name] MRN: [id number] DOB:05-16-2000, 23 y.o., adult Today's Date: 09/20/2023  END OF SESSION:  PT End of Session - 09/20/23 0849     Visit Number 1    Number of Visits 9    Date for PT Re-Evaluation 10/22/23    Authorization Type [employer]    PT Start Time 0847    PT Stop Time 0934    PT Time Calculation (min) 47 min    Activity Tolerance Patient tolerated treatment well;Treatment limited secondary to medical complications (Comment)   nausea   Behavior During Therapy [facility] for tasks assessed/performed          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   History of admission to inpatient psychiatry department 10/06/2022   History of suicide attempt 01/27/2021   Suicide attempt (HCC) 01/27/2021   2022 - overdose on trazodone  - wanted to die   12/22 - overdose on estradiol      Past Surgical History:  Procedure Laterality Date   ADENOIDECTOMY     EYE MUSCLE SURGERY Bilateral    x3   MYRINGOTOMY WITH TUBE PLACEMENT Bilateral    TONSILLECTOMY AND ADENOIDECTOMY Bilateral 02/03/2013   Procedure: TONSILLECTOMY AND ADENOIDECTOMY;  Surgeon: Alm Bouche, MD;  Location: [facility];  Service: ENT;  Laterality: Bilateral;   Patient Active Problem List   Diagnosis Date Noted   Long term current use of antipsychotic medication 01/07/2023   MDD (major depressive disorder), recurrent episode, severe (HCC) 12/25/2022   History of admission to inpatient psychiatry department 10/06/2022   GAD (generalized anxiety disorder) 01/15/2022   MDD (major depressive disorder) 01/14/2022   Suicidal ideation 01/13/2022   Borderline personality  disorder (HCC) 10/07/2021   Suspected autism disorder 09/11/2021   PTSD (post-traumatic stress disorder) 07/18/2021   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   Urge incontinence of urine 04/29/2021   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   Generalized anxiety disorder 03/10/2021   Major depressive disorder, recurrent, severe without psychotic features (HCC) 01/27/2021   History of suicide attempt 01/27/2021   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 A few months ago she was told that she had a tumor in her neck pressing up against her brainstem. She reports vertigo, nausea, tachycardia, inappropriate sweating and flushing as well.  Pt accompanied by: self  PERTINENT HISTORY: ADHD, anxiety, depression, paraganglion tumor  PAIN:  Are you having pain? No  PRECAUTIONS: Fall  RED FLAGS: Cervical red flags: Dysphagia Yes: new since discovery of tumor and Nausea Yes: new since discovery of tumor   WEIGHT BEARING RESTRICTIONS: No  FALLS: Has patient fallen in last 6 months? No  LIVING ENVIRONMENT: Lives with:  roommate Lives in: House/apartment Stairs: Yes: Internal: 2 steps; can reach both and External: a few steps; can reach both Has following equipment at home: None  PLOF: Independent  PATIENT GOALS: to not be dizzy  OBJECTIVE:  Note: Objective measures were completed at Evaluation unless otherwise noted.  DIAGNOSTIC FINDINGS:  07/21/23 neck MRI IMPRESSION: 2.6 cm well-circumscribed enhancing mass in the left parapharyngeal space abutting the left carotid space. 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 Given somewhat heterogeneous appearance on post-contrast and T2 images there are possible flow voids within the lesion as can be seen with paraganglioma. Additional considerations including salivary gland tumor or enlarged retropharyngeal lymph node are less likely.   No abnormally enlarged lymph nodes in the neck.   Mass abuts the mid/distal left cervical ICA with slight displacement of the vessel and possible mild narrowing without interruption of the flow void.  COGNITION: Overall cognitive status: Within functional limits for tasks assessed   SENSATION: WFL  POSTURE:  rounded shoulders and forward head  Cervical ROM:   WFL, no pain  STRENGTH: WFL   BED MOBILITY:  Dizziness with first laying down   VESTIBULAR ASSESSMENT:  GENERAL OBSERVATION: NAD, no AD   SYMPTOM BEHAVIOR:  Subjective history: see above  Non-Vestibular symptoms: headaches, tinnitus, and nausea/vomiting  Type of dizziness: I'm on a boat, spinning  Frequency: daily  Duration: a few minutes  Aggravating factors: Spontaneous, Induced by position change: lying supine, Induced by motion: looking up at the ceiling and sitting in a moving car, Worse with fatigue, Worse in the morning, Worse in the dark, Worse outside or in busy environment, and Occurs when standing still   Relieving factors: head stationary and rest  Progression of symptoms: better  OCULOMOTOR EXAM:  Ocular Alignment: normal does have glasses to see far, but not currently wearing   Ocular ROM: No Limitations  Spontaneous Nystagmus: absent  Gaze-Induced Nystagmus: absent  Smooth Pursuits: intact  Saccades: intact  Convergence/Divergence: 5 cm L eye suppressed, but h/o strabismus    VESTIBULAR - OCULAR REFLEX:   Slow VOR: Positive Bilaterally 3/5  VOR  Cancellation: Unable to Maintain Gaze 2/5  Head-Impulse Test: HIT Right: positive HIT Left: negative     POSITIONAL TESTING: Right Dix-Hallpike: upbeating, right nystagmus Left Dix-Hallpike: to be tested Right Roll Test: no nystagmus Left Roll Test: no nystagmus  MOTION SENSITIVITY:  Motion Sensitivity Quotient Intensity: 0 = none, 1 = Lightheaded, 2 = Mild, 3 = Moderate, 4 = Severe, 5 = Vomiting  Intensity  1. Sitting to supine 3  2. Supine to L side   3. Supine to R side   4. Supine to sitting   5. L Hallpike-Dix   6. Up from L    7. R Hallpike-Dix 4-5  8. Up from R  4-5  9. Sitting, head tipped to L knee   10. Head up from L knee   11. Sitting, head tipped to R knee   12. Head up from R knee   13. Sitting head turns x5   14.Sitting head nods x5   15. In stance, 180 turn to L    16. In stance, 180 turn to R  TREATMENT   Canalith Repositioning:  Epley Right: Number of Reps: 1, Response to Treatment: symptoms worsened/converted, and Comment: initial R torsional   Self care/home management:  -etiology of BPPV -limitations in treatment due to multifactorial cause  -initial HEP (see below)  PATIENT EDUCATION: Education details: PT POC, exam findings, see above, HEP Person educated: Patient Education method: Explanation, Demonstration, and Handouts Education comprehension: verbalized understanding and needs further education  HOME EXERCISE PROGRAM: Rolling    With pillow under head, start on back. Roll slowly to right. Hold position until symptoms subside. Roll slowly onto left side. Hold position until symptoms subside. Repeat sequence __3-5__ times per session. Do _3___ sessions per day.  *have a trash can nearby  GOALS: Goals reviewed with patient? Yes  SHORT TERM GOALS: = LTG based on PT POC length  LONG TERM GOALS: Target  date: 10/22/23  Pt will be independent with final HEP for improved symptom report  Baseline: to be provided  Goal status: INITIAL  2.  Patient will demonstrate (-) positional testing to indicate resolution of BPPV  Baseline: initially R posterior canal canalithiasis  Goal status: INITIAL   ASSESSMENT:  CLINICAL IMPRESSION: Patient is a 23 y.o. who was seen today for physical therapy evaluation and treatment for dizziness. 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 Her vestibular exam demonstrates a mix of both central and peripheral causes. Most notably was a positive R HIT and initially R posterior canal canalithiasis with immediate onset R torsional upbeating nystagmus lasting ~25s. This was treated with Epley x1. Upon recheck in R dix hallpike, patient with immediate onset, brisk apogeotropic nystagmus and onset of nausea. Patient provided with emesis bag and alcohol pad with some relief. She would benefit from skilled PT services to address the above mentioned deficits.   OBJECTIVE IMPAIRMENTS: dizziness.   ACTIVITY LIMITATIONS: bed mobility, bathing, hygiene/grooming, locomotion level, and caring for others  PARTICIPATION LIMITATIONS: meal prep, cleaning, interpersonal relationship, shopping, community activity, and occupation  PERSONAL FACTORS: Age, Past/current experiences, Profession, Social background, Time since onset of injury/illness/exacerbation, and Transportation are also affecting patient's functional outcome.   REHAB POTENTIAL: Fair central etiology as well  CLINICAL DECISION MAKING: Stable/uncomplicated  EVALUATION COMPLEXITY: Low   PLAN:  PT FREQUENCY: 2x/week  PT DURATION: 4 weeks  PLANNED INTERVENTIONS: 97164- PT Re-evaluation, 97750- Physical Performance Testing, 97110-Therapeutic exercises, 97530- Therapeutic activity, [id number]- Neuromuscular re-education, 97535- Self Care, [id number]- Manual  therapy, [phone]- Gait training, [phone]- Canalith repositioning, Patient/Family education, Balance training, Stair training, Vestibular training, Visual/preceptual remediation/compensation, Cognitive remediation, and DME instructions  PLAN FOR NEXT SESSION: recheck R horizontal? R posterior?   Delon DELENA Pop, PT Delon DELENA Pop, PT, DPT, CBIS  09/20/2023, 11:07 AM

## 2023-09-23 ENCOUNTER — Ambulatory Visit: Payer: MEDICAID

## 2023-09-23 DIAGNOSIS — R2681 Unsteadiness on feet: Secondary | ICD-10-CM

## 2023-09-23 DIAGNOSIS — R42 Dizziness and giddiness: Secondary | ICD-10-CM

## 2023-09-23 NOTE — Therapy (Signed)
 OUTPATIENT PHYSICAL THERAPY VESTIBULAR TREATMENT     Patient Name: Scott [name] MRN: [id number] DOB:09-11-2000, 23 y.o., adult Today's Date: 09/23/2023  END OF SESSION:  PT End of Session - 09/23/23 0826     Visit Number 2    Number of Visits 9    Date for PT Re-Evaluation 10/22/23    Authorization Type [employer]    PT Start Time 0830    PT Stop Time 0910    PT Time Calculation (min) 40 min    Activity Tolerance Patient tolerated treatment well;Treatment limited secondary to medical complications (Comment)   nausea   Behavior During Therapy [facility] for tasks assessed/performed          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   History of admission to inpatient psychiatry department 10/06/2022   History of suicide attempt 01/27/2021   Suicide attempt (HCC) 01/27/2021   2022 - overdose on trazodone  - wanted to die   12/22 - overdose on estradiol      Past Surgical History:  Procedure Laterality Date   ADENOIDECTOMY     EYE MUSCLE SURGERY Bilateral    x3   MYRINGOTOMY WITH TUBE PLACEMENT Bilateral    TONSILLECTOMY AND ADENOIDECTOMY Bilateral 02/03/2013   Procedure: TONSILLECTOMY AND ADENOIDECTOMY;  Surgeon: Alm Bouche, MD;  Location: [facility];  Service: ENT;  Laterality: Bilateral;   Patient Active Problem List   Diagnosis Date Noted   Long term current use of antipsychotic medication 01/07/2023   MDD (major depressive disorder), recurrent episode, severe (HCC) 12/25/2022   History of admission to inpatient psychiatry department 10/06/2022   GAD (generalized anxiety disorder) 01/15/2022   MDD (major depressive disorder) 01/14/2022   Suicidal ideation 01/13/2022   Borderline personality  disorder (HCC) 10/07/2021   Suspected autism disorder 09/11/2021   PTSD (post-traumatic stress disorder) 07/18/2021   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   Urge incontinence of urine 04/29/2021   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   Generalized anxiety disorder 03/10/2021   Major depressive disorder, recurrent, severe without psychotic features (HCC) 01/27/2021   History of suicide attempt 01/27/2021   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 Reports that dizziness is less often, but still present. Denies falls.  Pt accompanied by: self  PERTINENT HISTORY: ADHD, anxiety, depression, paraganglion tumor  PAIN:  Are you having pain? No  PRECAUTIONS: Fall   PATIENT GOALS: to not be dizzy  OBJECTIVE:  Note: Objective measures were completed at Evaluation unless otherwise noted.  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 Given somewhat heterogeneous appearance on post-contrast and T2 images there are possible flow voids within the lesion as can be seen with paraganglioma. Additional considerations including salivary gland  tumor or enlarged retropharyngeal lymph node are less likely.   No abnormally enlarged lymph nodes in the neck.   Mass abuts the mid/distal left cervical ICA with slight displacement of the vessel and possible mild narrowing without  interruption of the flow void.   VESTIBULAR ASSESSMENT:  GENERAL OBSERVATION: NAD, no AD   SYMPTOM BEHAVIOR:  Subjective history: see above  Non-Vestibular symptoms: headaches, tinnitus, and nausea/vomiting  Type of dizziness: I'm on a boat, spinning  Frequency: daily  Duration: a few minutes  Aggravating factors: Spontaneous, Induced by position change: lying supine, Induced by motion: looking up at the ceiling and sitting in a moving car, Worse with fatigue, Worse in the morning, Worse in the dark, Worse outside or in busy environment, and Occurs when standing still   Relieving factors: head stationary and rest  Progression of symptoms: better  VESTIBULAR - OCULAR REFLEX:   Slow VOR: Positive Bilaterally 3/5  VOR Cancellation: Unable to Maintain Gaze 2/5  Head-Impulse Test: HIT Right: positive HIT Left: negative     POSITIONAL TESTING:  R roll test: (-) L roll test: (-)  L dix hallpike: small amplitude L torsional upbeating nystagmus that fatigued after ~10s  R dix hallpike: brisk apogeotropic nystagmus- unable to assess whether or not it fatigued as patient needed to sit up due to feeling ill  Roll R: low amplitude R torsional upbeating nystagmus  Roll L: low amplitude L torsional upbeating nystagmus                                                                                                                             TREATMENT   Canalith Repositioning: L Epley:   1st position: low amplitude L torsional upbeating nystagmus that fatigued after ~10s  2nd position: immediate onset apogeotropic brisk nystagmus    -with head moved to neutral extension switched to low amplitude R torsional upbeating nystagmus that fatigued in ~5s   3rd position: low amplitude R torsional upbeating nystagmus that fatigued in ~5s   Sitting up: no dizziness reported   Attempted L Epley again:  1st position: low amplitude L torsional upbeating nystagmus fatiguing after ~5-7s   2nd  position: initially very brisk apogeotropic nystagmus   -unable to assess for fatiguing/transitioning as patient elected to abort maneuver and sat up   PATIENT EDUCATION: Education details: PT POC, exam findings, see above, HEP Person educated: Patient Education method: Explanation, Demonstration, and Handouts Education comprehension: verbalized understanding and needs further education  HOME EXERCISE PROGRAM: Rolling    With pillow under head, start on back. Roll slowly to right. Hold position until symptoms subside. Roll slowly onto left side. Hold position until symptoms subside. Repeat sequence __3-5__ times per session. Do _3___ sessions per day.  *have a trash can nearby  GOALS: Goals reviewed with patient? Yes  SHORT TERM GOALS: = LTG based on PT POC length  LONG TERM GOALS: Target date: 10/22/23  Pt will be independent with final  HEP for improved symptom report  Baseline: to be provided  Goal status: INITIAL  2.  Patient will demonstrate (-) positional testing to indicate resolution of BPPV  Baseline: initially R posterior canal canalithiasis  Goal status: INITIAL   ASSESSMENT:  CLINICAL IMPRESSION: Patient seen for skilled PT session with emphasis on canalith repositioning. Roll test initially negative bilaterally. L dix hallpike initially positive for low amplitude L posterior canal canalithiasis. R dix hallpike initially demonstrate very brisk apogeotropic nystagmus, even with sufficient cervical extension. When rolling R, patient demonstrating low amplitude R torsional upbeating nystagmus that eventually fatigued. When rolling L there was low amplitude L torsional upbeating nystagmus that eventually fatigued. Treated with L epley as those findings were most consistent with L posterior canal BPPV. Tolerated 1st Epley well with dizziness in all positions, except when sitting up. In 2nd position patient had, again, apogeotropic nystagmus. Interestingly, when PT elevated  patients head to neutral, the nystagmus transitioned to R torsional upbeating nystagmus that eventually fatigued. Patient unable to tolerate 2nd round of L Epley and elected to sit up. At this time, it does remain unclear whether her dizziness and nystagmus is peripheral or central in etiology. It is position dependent and in some positions does fatigue. However, the nature of the nystagmus is not consistent with presumed anatomy of semi circular canals. Continue POC as able.   OBJECTIVE IMPAIRMENTS: dizziness.   ACTIVITY LIMITATIONS: bed mobility, bathing, hygiene/grooming, locomotion level, and caring for others  PARTICIPATION LIMITATIONS: meal prep, cleaning, interpersonal relationship, shopping, community activity, and occupation  PERSONAL FACTORS: Age, Past/current experiences, Profession, Social background, Time since onset of injury/illness/exacerbation, and Transportation are also affecting patient's functional outcome.   REHAB POTENTIAL: Fair central etiology as well  CLINICAL DECISION MAKING: Stable/uncomplicated  EVALUATION COMPLEXITY: Low   PLAN:  PT FREQUENCY: 2x/week  PT DURATION: 4 weeks  PLANNED INTERVENTIONS: 97164- PT Re-evaluation, 97750- Physical Performance Testing, 97110-Therapeutic exercises, 97530- Therapeutic activity, 97112- Neuromuscular re-education, 97535- Self Care, [id number]- Manual therapy, [phone]- Gait training, [phone]- Canalith repositioning, Patient/Family education, Balance training, Stair training, Vestibular training, Visual/preceptual remediation/compensation, Cognitive remediation, and DME instructions  PLAN FOR NEXT SESSION: recheck positional   Delon DELENA Pop, PT Delon DELENA Pop, PT, DPT, CBIS  09/23/2023, 9:44 AM

## 2023-09-27 ENCOUNTER — Ambulatory Visit: Payer: MEDICAID

## 2023-09-27 DIAGNOSIS — R2681 Unsteadiness on feet: Secondary | ICD-10-CM | POA: Diagnosis not present

## 2023-09-27 DIAGNOSIS — R42 Dizziness and giddiness: Secondary | ICD-10-CM

## 2023-09-27 NOTE — Therapy (Signed)
 OUTPATIENT PHYSICAL THERAPY VESTIBULAR TREATMENT     Patient Name: Scott [name] MRN: [id number] DOB:February 05, 2001, 23 y.o., adult Today's Date: 09/27/2023  END OF SESSION:  PT End of Session - 09/27/23 0754     Visit Number 3    Number of Visits 9    Date for PT Re-Evaluation 10/22/23    Authorization Type [employer]    PT Start Time 0757    PT Stop Time 0811   BPPV tx   PT Time Calculation (min) 14 min    Activity Tolerance Patient tolerated treatment well    Behavior During Therapy [facility] for tasks assessed/performed          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   History of admission to inpatient psychiatry department 10/06/2022   History of suicide attempt 01/27/2021   Suicide attempt (HCC) 01/27/2021   2022 - overdose on trazodone - wanted to die   12/22 - overdose on estradiol     Past Surgical History:  Procedure Laterality Date   ADENOIDECTOMY     EYE MUSCLE SURGERY Bilateral    x3   MYRINGOTOMY WITH TUBE PLACEMENT Bilateral    TONSILLECTOMY AND ADENOIDECTOMY Bilateral 02/03/2013   Procedure: TONSILLECTOMY AND ADENOIDECTOMY;  Surgeon: Alm Bouche, MD;  Location: [facility];  Service: ENT;  Laterality: Bilateral;   Patient Active Problem List   Diagnosis Date Noted   Long term current use of antipsychotic medication 01/07/2023   MDD (major depressive disorder), recurrent episode, severe (HCC) 12/25/2022   History of admission to inpatient psychiatry department 10/06/2022   GAD (generalized anxiety disorder) 01/15/2022   MDD (major depressive disorder) 01/14/2022   Suicidal ideation 01/13/2022   Borderline personality disorder (HCC) 10/07/2021   Suspected autism disorder  09/11/2021   PTSD (post-traumatic stress disorder) 07/18/2021   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   Urge incontinence of urine 04/29/2021   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   Generalized anxiety disorder 03/10/2021   Major depressive disorder, recurrent, severe without psychotic features (HCC) 01/27/2021   History of suicide attempt 01/27/2021   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 Tested negative for COVID 2x over the weekend. Intensity and frequency has decreased. Denies falls.  Pt accompanied by: self  PERTINENT HISTORY: ADHD, anxiety, depression, paraganglion tumor  PAIN:  Are you having pain? No  PRECAUTIONS: Fall   PATIENT GOALS: to not be dizzy  OBJECTIVE:  Note: Objective measures were completed at Evaluation unless otherwise noted.  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 Given somewhat heterogeneous appearance on post-contrast and T2 images there are possible flow voids within the lesion as can be seen with paraganglioma. Additional considerations including salivary gland tumor  or enlarged retropharyngeal lymph node are less likely.   No abnormally enlarged lymph nodes in the neck.   Mass abuts the mid/distal left cervical ICA with slight displacement of the vessel and possible mild narrowing without interruption of the flow  void.   VESTIBULAR ASSESSMENT:  GENERAL OBSERVATION: NAD, no AD   SYMPTOM BEHAVIOR:  Subjective history: see above  Non-Vestibular symptoms: headaches, tinnitus, and nausea/vomiting  Type of dizziness: I'm on a boat, spinning  Frequency: daily  Duration: a few minutes  Aggravating factors: Spontaneous, Induced by position change: lying supine, Induced by motion: looking up at the ceiling and sitting in a moving car, Worse with fatigue, Worse in the morning, Worse in the dark, Worse outside or in busy environment, and Occurs when standing still   Relieving factors: head stationary and rest  Progression of symptoms: better  VESTIBULAR - OCULAR REFLEX:   Slow VOR: Positive Bilaterally 3/5  VOR Cancellation: Unable to Maintain Gaze 2/5  Head-Impulse Test: HIT Right: positive HIT Left: negative     POSITIONAL TESTING:  R roll test: (-) L roll test: (-)  L dix hallpike: (-) R dix hallpike: (-) Roll R: (-) Roll L: (-) Cervical extension: (-) Cervical flexion: (-)                                                                                                                           TREATMENT  -Education on current presentation (clear of BPPV at this time)   PATIENT EDUCATION: Education details: PT POC, exam findings, see above, HEP Person educated: Patient Education method: Explanation, Demonstration, and Handouts Education comprehension: verbalized understanding and needs further education  HOME EXERCISE PROGRAM: Rolling    With pillow under head, start on back. Roll slowly to right. Hold position until symptoms subside. Roll slowly onto left side. Hold position until symptoms subside. Repeat sequence __3-5__ times per session. Do _3___ sessions per day.  *have a trash can nearby  GOALS: Goals reviewed with patient? Yes  SHORT TERM GOALS: = LTG based on PT POC length  LONG TERM GOALS: Target date: 10/22/23  Pt will be independent with final HEP for improved  symptom report  Baseline: to be provided  Goal status: INITIAL  2.  Patient will demonstrate (-) positional testing to indicate resolution of BPPV  Baseline: initially R posterior canal canalithiasis  Goal status: INITIAL   ASSESSMENT:  CLINICAL IMPRESSION: Patient seen for skilled PT session with emphasis on positional testing. At this time, all canals appear to be free of BPPV. No reports of spinning dizziness, nor nystagmus noted/reported. Scheduled an additional appt to re-test PRN. Continue POC as needed.   OBJECTIVE IMPAIRMENTS: dizziness.   ACTIVITY LIMITATIONS: bed mobility, bathing, hygiene/grooming, locomotion level, and caring for others  PARTICIPATION LIMITATIONS: meal prep, cleaning, interpersonal relationship, shopping, community activity, and occupation  PERSONAL FACTORS: Age, Past/current experiences, Profession, Social background, Time since onset of injury/illness/exacerbation, and Transportation are also affecting patient's functional outcome.  REHAB POTENTIAL: Fair central etiology as well  CLINICAL DECISION MAKING: Stable/uncomplicated  EVALUATION COMPLEXITY: Low   PLAN:  PT FREQUENCY: 2x/week  PT DURATION: 4 weeks  PLANNED INTERVENTIONS: 97164- PT Re-evaluation, 97750- Physical Performance Testing, 97110-Therapeutic exercises, 97530- Therapeutic activity, 97112- Neuromuscular re-education, 97535- Self Care, 02859- Manual therapy, [phone]- Gait training, [phone]- Canalith repositioning, Patient/Family education, Balance training, Stair training, Vestibular training, Visual/preceptual remediation/compensation, Cognitive remediation, and DME instructions  PLAN FOR NEXT SESSION: recheck positional   Delon DELENA Pop, PT Delon DELENA Pop, PT, DPT, CBIS  09/27/2023, 8:21 AM

## 2023-09-30 ENCOUNTER — Ambulatory Visit: Payer: MEDICAID

## 2023-10-04 ENCOUNTER — Ambulatory Visit: Payer: MEDICAID
